# Patient Record
Sex: Male | Born: 1960
Health system: Southern US, Community
[De-identification: ages and names within clinical notes are randomized; demographics above are authoritative.]

## PROBLEM LIST (undated history)

## (undated) DIAGNOSIS — E669 Obesity, unspecified: Secondary | ICD-10-CM

## (undated) DIAGNOSIS — I10 Essential (primary) hypertension: Secondary | ICD-10-CM

## (undated) DIAGNOSIS — H9190 Unspecified hearing loss, unspecified ear: Secondary | ICD-10-CM

## (undated) HISTORY — DX: Essential (primary) hypertension: I10

## (undated) HISTORY — DX: Obesity, unspecified: E66.9

## (undated) HISTORY — DX: Unspecified hearing loss, unspecified ear: H91.90

---
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
## 1985-05-17 HISTORY — PX: MOLE REMOVAL: SHX2046

## 1999-05-18 HISTORY — PX: LASIK: SHX215

## 2005-05-17 HISTORY — PX: KNEE ARTHROSCOPY: SUR90

## 2012-07-26 ENCOUNTER — Encounter: Payer: Self-pay | Admitting: Internal Medicine

## 2012-09-28 ENCOUNTER — Ambulatory Visit (INDEPENDENT_AMBULATORY_CARE_PROVIDER_SITE_OTHER): Payer: No Typology Code available for payment source | Admitting: Internal Medicine

## 2012-09-28 ENCOUNTER — Encounter: Payer: Self-pay | Admitting: Internal Medicine

## 2012-09-28 VITALS — BP 148/90 | HR 77 | Temp 97.8°F | Ht 72.0 in | Wt 232.0 lb

## 2012-09-28 DIAGNOSIS — H9193 Unspecified hearing loss, bilateral: Secondary | ICD-10-CM

## 2012-09-28 DIAGNOSIS — H9190 Unspecified hearing loss, unspecified ear: Secondary | ICD-10-CM | POA: Insufficient documentation

## 2012-09-28 DIAGNOSIS — L989 Disorder of the skin and subcutaneous tissue, unspecified: Secondary | ICD-10-CM | POA: Insufficient documentation

## 2012-09-28 DIAGNOSIS — I10 Essential (primary) hypertension: Secondary | ICD-10-CM | POA: Insufficient documentation

## 2012-09-28 DIAGNOSIS — E669 Obesity, unspecified: Secondary | ICD-10-CM | POA: Insufficient documentation

## 2012-09-28 LAB — CBC WITH DIFFERENTIAL/PLATELET
Eosinophils Relative: 10.6 % — ABNORMAL HIGH (ref 0.0–5.0)
MCV: 90.5 fl (ref 78.0–100.0)
Monocytes Absolute: 0.5 10*3/uL (ref 0.1–1.0)
Neutrophils Relative %: 60.4 % (ref 43.0–77.0)
Platelets: 275 10*3/uL (ref 150.0–400.0)
WBC: 7.1 10*3/uL (ref 4.5–10.5)

## 2012-09-28 LAB — COMPREHENSIVE METABOLIC PANEL
Albumin: 4 g/dL (ref 3.5–5.2)
Alkaline Phosphatase: 72 U/L (ref 39–117)
CO2: 28 mEq/L (ref 19–32)
Calcium: 9.3 mg/dL (ref 8.4–10.5)
Chloride: 102 mEq/L (ref 96–112)
GFR: 92.74 mL/min (ref >60.00)
Glucose, Bld: 92 mg/dL (ref 70–99)
Potassium: 3.3 mEq/L — ABNORMAL LOW (ref 3.5–5.1)
Sodium: 139 mEq/L (ref 135–145)
Total Protein: 6.9 g/dL (ref 6.0–8.3)

## 2012-09-28 LAB — TSH: TSH: 0.76 u[IU]/mL (ref 0.35–5.50)

## 2012-09-28 MED ORDER — ATENOLOL 50 MG PO TABS: 50.0000 mg | ORAL_TABLET | Freq: Every day | ORAL | Status: DC

## 2012-09-28 NOTE — Assessment & Plan Note (Signed)
He was a serious 240 pounds in 2012, was able to go down to 200 pounds with diet and exercise. Currently 232. Plan: Advised diet and exercise, check a TSH.

## 2012-09-28 NOTE — Patient Instructions (Addendum)
Go back to atenolol 50 mg daily. Check the  blood pressure 2 or 3 times a week, be sure it is between 110/60 and 140/85. If it is consistently higher or lower, let me know Exercise 3 hours weekly Please read the information about a healthy diet. --- Come back in 6 months, fasting for a physical exam.

## 2012-09-28 NOTE — Assessment & Plan Note (Signed)
Has two skin tags and  a variety of skin lesions, has never seen dermatology is, he is somehow concerned Plan: Refer to dermatology for a checkup and consideration of skin tag removal

## 2012-09-28 NOTE — Assessment & Plan Note (Addendum)
History of hypertension, BP was well-controlled with atenolol 50 mg daily. For the last year and a half has been taking the medication sporadically, no meds for the last week. Ambulatory BPs in the 140s/90s. Plan: Labs Restart atenolol Get records. Addendum:  multiple records reviewed, keeping most recent labs, other records back to pt for safekeeping. (At some point TG were in the 300s) JP 10-14-12

## 2012-09-28 NOTE — Progress Notes (Signed)
  Subjective:    Patient ID: Zachary Cannon, male    DOB: Sep 17, 1960, 52 y.o.   MRN: [id number]  HPI New patient, here to get established. Hypertension, use to take atenolol regularly, for the last year and a half has been taking it sporadically d/t a insurance issue. Atenolol is the only medication he ever tried, it used to work very well for him. Obesity, he has been as heavy as  240 pounds. See assessment and plan. For 6 weeks history of left elbow pain, no injury, aspirin helps temporarily. Pain is on and off Has a number of skin lesions and skin tags , he is concerned about the lesions, would like skin tags removed.  Past Medical History  Diagnosis Date  . Hypertension   . Obesity (BMI 30-39.9)   . HOH (hard of hearing)     uses hearing aids age 34   Past Surgical History  Procedure Laterality Date  . Nose surgery  1982    deviated septum  . Knee arthroscopy Right 2007    meniscal tear  . Lasik  2001  . Mole removal  1987    benign, foot   History   Social History  . Marital Status: Single    Spouse Name: N/A    Number of Children: 0  . Years of Education: N/A   Occupational History  . sales rep    Social History Main Topics  . Smoking status: Never Smoker   . Smokeless tobacco: Never Used  . Alcohol Use: Yes     Comment: rarely   . Drug Use: No  . Sexually Active: Not on file   Other Topics Concern  . Not on file   Social History Narrative   Moved from [place] ~ 2012 to [address], then to [place] 2013    Family History  Problem Relation Age of Onset  . CAD Neg Hx   . Diabetes Father     late in life  . Colon cancer Neg Hx   . Prostate cancer Neg Hx      Review of Systems No chest pain or shortness or breath. No lower extremity edema.     Objective:   Physical Exam BP 148/90  Pulse 77  Temp(Src) 97.8 F (36.6 C) (Oral)  Ht 6' (1.829 m)  Wt 232 lb (105.235 kg)  BMI 31.46 kg/m2  SpO2 98%  General -- alert, well-developed,   Neck --no  thyromegaly Lungs -- normal respiratory effort, no intercostal retractions, no accessory muscle use, and normal breath sounds.   Heart-- normal rate, regular rhythm, no murmur, and no gallop.   Abdomen--soft, non-tender, no distention, no masses, no HSM, no guarding, and no rigidity.   Extremities-- no pretibial edema bilaterally  Neurologic-- alert & oriented X3 and strength normal in all extremities. Skin: Benign appearing skin tags at the  right side of the neck and left buttock. He has a variety of skin lesions. Psych-- Cognition and judgment appear intact. Alert and cooperative with normal attention span and concentration.  not anxious appearing and not depressed appearing.      Assessment & Plan:  Tennis elbow, Recommend Motrin when necessary, ice, call if no better in few weeks

## 2012-10-03 ENCOUNTER — Encounter: Payer: Self-pay | Admitting: *Deleted

## 2012-10-25 ENCOUNTER — Encounter: Payer: Self-pay | Admitting: Internal Medicine

## 2012-12-05 ENCOUNTER — Encounter: Payer: Self-pay | Admitting: Internal Medicine

## 2012-12-05 ENCOUNTER — Ambulatory Visit (INDEPENDENT_AMBULATORY_CARE_PROVIDER_SITE_OTHER): Payer: No Typology Code available for payment source | Admitting: Internal Medicine

## 2012-12-05 VITALS — BP 130/98 | HR 64 | Temp 98.6°F | Wt 239.0 lb

## 2012-12-05 DIAGNOSIS — S8991XA Unspecified injury of right lower leg, initial encounter: Secondary | ICD-10-CM

## 2012-12-05 DIAGNOSIS — S8990XA Unspecified injury of unspecified lower leg, initial encounter: Secondary | ICD-10-CM | POA: Insufficient documentation

## 2012-12-05 DIAGNOSIS — S99919A Unspecified injury of unspecified ankle, initial encounter: Secondary | ICD-10-CM

## 2012-12-05 DIAGNOSIS — I10 Essential (primary) hypertension: Secondary | ICD-10-CM

## 2012-12-05 NOTE — Patient Instructions (Addendum)
Ice the knee every night x 20 minutes Motrin 200 mg 2 tablets every 6 hours as needed for pain. Always take it with food because may cause gastritis and ulcers. If you notice nausea, stomach pain, change in the color of stools --->  Stop the medicine and let us know  Increase atenolol to 1.5 tabs a day Check the  blood pressure 2 or 3 times a week, be sure it is between 110/60 and 140/85. If it is consistently higher or lower, let me know Also call if the pulse is less than 55

## 2012-12-05 NOTE — Progress Notes (Signed)
  Subjective:    Patient ID: Zachary Cannon, male    DOB: 09-23-1960, 52 y.o.   MRN: [id number]  HPI Acute visit 3 days ago, was walking in his house, turned to the left, he felt a pop in the right knee. Since then he is having anterior-below the knee cup pain and some swelling and stiffness. Definitely the pain is different than his usual knee ache and  it is interfering with walking. Not  taking any medication for pain.  Past Medical History  Diagnosis Date  . Hypertension   . Obesity (BMI 30-39.9)   . HOH (hard of hearing)     uses hearing aids age 30   Past Surgical History  Procedure Laterality Date  . Nose surgery  1982    deviated septum  . Knee arthroscopy Right 2007    meniscal tear  . Lasik  2001  . Mole removal  1987    benign, foot     Review of Systems Good compliance with atenolol, BP today 150/98, not ambulatory BPs. Pulse 64    Objective:   Physical Exam BP 130/98  Pulse 64  Temp(Src) 98.6 F (37 C) (Other (Comment))  Wt 239 lb (108.41 kg)  BMI 32.41 kg/m2  SpO2 96% General -- alert, well-developed, NAD    Extremities-- no pretibial edema bilaterally L knee normal R knee-- small effusion on exam, ROM full but c/o pain w/ flexion, no red, slt warm, joint is table  Psych-- Cognition and judgment appear intact. Alert and cooperative with normal attention span and concentration.  not anxious appearing and not depressed appearing.        Assessment & Plan:

## 2012-12-05 NOTE — Assessment & Plan Note (Signed)
BP slightly elevated today, increase atenolol from 50 mg to 75 mg, see instructions

## 2012-12-05 NOTE — Assessment & Plan Note (Signed)
Knee injury, H/o previous surgery in that knee. Suspect internal derangement Refer to ortho, see instructions.

## 2013-06-08 ENCOUNTER — Other Ambulatory Visit: Payer: Self-pay | Admitting: Internal Medicine

## 2013-11-26 ENCOUNTER — Encounter: Payer: Self-pay | Admitting: Physician Assistant

## 2013-11-26 ENCOUNTER — Ambulatory Visit (INDEPENDENT_AMBULATORY_CARE_PROVIDER_SITE_OTHER): Payer: BC Managed Care – PPO | Admitting: Physician Assistant

## 2013-11-26 VITALS — BP 105/70 | HR 72 | Temp 97.8°F | Resp 16 | Ht 72.0 in | Wt 237.1 lb

## 2013-11-26 DIAGNOSIS — K644 Residual hemorrhoidal skin tags: Secondary | ICD-10-CM

## 2013-11-26 MED ORDER — HYDROCORTISONE ACE-PRAMOXINE 1-1 % RE FOAM: 1.0000 | Freq: Two times a day (BID) | RECTAL | Status: DC

## 2013-11-26 NOTE — Progress Notes (Signed)
Patient presents to clinic today c/o a few days of rectal pain with bright red blood on toilet paper after BM.  Patient denies abdominal pain, nausea, vomiting, fever or malaise.  Denies melena.  Denies hx of hemorrhoid or anal fissure.  Patient endorses some hard stools with straining.  Is not on fiber supplement.  Patient denies recent sexual activity.  Past Medical History  Diagnosis Date  . Hypertension   . Obesity (BMI 30-39.9)   . HOH (hard of hearing)     uses hearing aids age 53    Current Outpatient Prescriptions on File Prior to Visit  Medication Sig Dispense Refill  . Ascorbic Acid (VITAMIN C PO) Take 1 tablet by mouth daily.      Marland Kitchen. atenolol (TENORMIN) 50 MG tablet Take one tablet by mouth one time daily  90 tablet  1   No current facility-administered medications on file prior to visit.    No Known Allergies  Family History  Problem Relation Age of Onset  . CAD Neg Hx   . Diabetes Father     late in life  . Colon cancer Neg Hx   . Prostate cancer Neg Hx     History   Social History  . Marital Status: Single    Spouse Name: N/A    Number of Children: 0  . Years of Education: N/A   Occupational History  . sales rep    Social History Main Topics  . Smoking status: Never Smoker   . Smokeless tobacco: Never Used  . Alcohol Use: Yes     Comment: rarely   . Drug Use: No  . Sexual Activity: None   Other Topics Concern  . None   Social History Narrative   [place] ~ [place] [address]    Review of Systems - See HPI.  All other ROS are negative.  BP 105/70  Pulse 72  Temp(Src) 97.8 F (36.6 C) (Oral)  Resp 16  Ht 6' (1.829 m)  Wt 237 lb 2 oz (107.559 kg)  BMI 32.15 kg/m2  SpO2 96%  Physical Exam  Vitals reviewed. Constitutional: He is well-developed, well-nourished, and in no distress.  HENT:  Head: Normocephalic and atraumatic.  Eyes: Conjunctivae are normal.  Cardiovascular: Normal rate, regular rhythm, normal  heart sounds and intact distal pulses.   Pulmonary/Chest: Effort normal and breath sounds normal. No respiratory distress. He has no wheezes. He has no rales. He exhibits no tenderness.  Genitourinary: Prostate normal. Rectal exam shows external hemorrhoid and tenderness. Rectal exam shows no internal hemorrhoid, no fissure and no laceration. Guaiac negative stool.  Skin: Skin is warm and dry. No rash noted.  Psychiatric: Affect normal.   Assessment/Plan: External hemorrhoid Rx Proctofoam HC. Increase fluid intake.  Daily fiber supplement.  Stool softener.  Encouraged sitz baths. IF symptoms not improving over the next week, will need evaluation by colorectal surgery.

## 2013-11-26 NOTE — Patient Instructions (Signed)
Use Proctofoam as directed.  Increase fluid.  Use a stool softener.  Soak in warm water for 15 minutes a couple of times per day.  This will promote healing and cleaning.  Avoid wet wipes as they are irritating your skin.  Take a fiber supplement.  If symptoms are not improving or if pain worsens, we will need to set you up with a General Surgeon for further treatment.  Hemorrhoids Hemorrhoids are swollen veins around the rectum or anus. There are two types of hemorrhoids:   Internal hemorrhoids. These occur in the veins just inside the rectum. They may poke through to the outside and become irritated and painful.  External hemorrhoids. These occur in the veins outside the anus and can be felt as a painful swelling or hard lump near the anus. CAUSES  Pregnancy.   Obesity.   Constipation or diarrhea.   Straining to have a bowel movement.   Sitting for long periods on the toilet.  Heavy lifting or other activity that caused you to strain.  Anal intercourse. SYMPTOMS   Pain.   Anal itching or irritation.   Rectal bleeding.   Fecal leakage.   Anal swelling.   One or more lumps around the anus.  DIAGNOSIS  Your caregiver may be able to diagnose hemorrhoids by visual examination. Other examinations or tests that may be performed include:   Examination of the rectal area with a gloved hand (digital rectal exam).   Examination of anal canal using a small tube (scope).   A blood test if you have lost a significant amount of blood.  A test to look inside the colon (sigmoidoscopy or colonoscopy). TREATMENT Most hemorrhoids can be treated at home. However, if symptoms do not seem to be getting better or if you have a lot of rectal bleeding, your caregiver may perform a procedure to help make the hemorrhoids get smaller or remove them completely. Possible treatments include:   Placing a rubber band at the base of the hemorrhoid to cut off the circulation (rubber band  ligation).   Injecting a chemical to shrink the hemorrhoid (sclerotherapy).   Using a tool to burn the hemorrhoid (infrared light therapy).   Surgically removing the hemorrhoid (hemorrhoidectomy).   Stapling the hemorrhoid to block blood flow to the tissue (hemorrhoid stapling).  HOME CARE INSTRUCTIONS   Eat foods with fiber, such as whole grains, beans, nuts, fruits, and vegetables. Ask your doctor about taking products with added fiber in them (fibersupplements).  Increase fluid intake. Drink enough water and fluids to keep your urine clear or pale yellow.   Exercise regularly.   Go to the bathroom when you have the urge to have a bowel movement. Do not wait.   Avoid straining to have bowel movements.   Keep the anal area dry and clean. Use wet toilet paper or moist towelettes after a bowel movement.   Medicated creams and suppositories may be used or applied as directed.   Only take over-the-counter or prescription medicines as directed by your caregiver.   Take warm sitz baths for 15-20 minutes, 3-4 times a day to ease pain and discomfort.   Place ice packs on the hemorrhoids if they are tender and swollen. Using ice packs between sitz baths may be helpful.   Put ice in a plastic bag.   Place a towel between your skin and the bag.   Leave the ice on for 15-20 minutes, 3-4 times a day.   Do not use a  donut-shaped pillow or sit on the toilet for long periods. This increases blood pooling and pain.  SEEK MEDICAL CARE IF:  You have increasing pain and swelling that is not controlled by treatment or medicine.  You have uncontrolled bleeding.  You have difficulty or you are unable to have a bowel movement.  You have pain or inflammation outside the area of the hemorrhoids. MAKE SURE YOU:  Understand these instructions.  Will watch your condition.  Will get help right away if you are not doing well or get worse. Document Released: 04/30/2000  Document Revised: 04/19/2012 Document Reviewed: 03/07/2012 [facility] Patient Information 2015 [place] [place]. This information is not intended to replace advice given to you by your health care provider. Make sure you discuss any questions you have with your health care provider.

## 2013-11-26 NOTE — Progress Notes (Signed)
Pre visit review using our clinic review tool, if applicable. No additional management support is needed unless otherwise documented below in the visit note/SLS  

## 2013-11-27 DIAGNOSIS — K644 Residual hemorrhoidal skin tags: Secondary | ICD-10-CM | POA: Insufficient documentation

## 2013-11-27 NOTE — Assessment & Plan Note (Signed)
Rx Proctofoam HC. Increase fluid intake.  Daily fiber supplement.  Stool softener.  Encouraged sitz baths. IF symptoms not improving over the next week, will need evaluation by colorectal surgery.

## 2013-11-29 ENCOUNTER — Telehealth: Payer: Self-pay | Admitting: Internal Medicine

## 2013-11-29 NOTE — Telephone Encounter (Signed)
Patient left message stating that he is having more bleeding since his visit but he thinks it may be due to the medication applicator, he is now applying with latex glove on hand. He states he understands that it may take several days for the swelling to go down so he will see how they are at the beginning of next week

## 2013-11-29 NOTE — Telephone Encounter (Signed)
Patient called stating that his hemorrhoids are still not better. Please advise.

## 2013-11-29 NOTE — Telephone Encounter (Signed)
LMOM with contact name and number for return call RE: hemorrhoids and further provider instructions/SLS

## 2013-11-29 NOTE — Telephone Encounter (Signed)
Provider informed/SLS 

## 2013-11-29 NOTE — Telephone Encounter (Signed)
It will take more than 1.5 days for the medicine to take full effect.  If he is having significant pain, I will set him up urgently with a Colorectal surgeon.  Please let me know if patient agrees.

## 2013-12-20 ENCOUNTER — Other Ambulatory Visit: Payer: Self-pay | Admitting: Internal Medicine

## 2014-05-13 ENCOUNTER — Telehealth: Payer: Self-pay | Admitting: Internal Medicine

## 2014-05-13 MED ORDER — ATENOLOL 50 MG PO TABS: 50.0000 mg | ORAL_TABLET | Freq: Every day | ORAL | Status: DC

## 2014-05-13 NOTE — Telephone Encounter (Signed)
Caller name: Fayrene Fearingjames Relation to pt: self Call back number: [phone] [facility] [address]   Reason for call:   Patient states that his insurance will lapse at the end of this month and would like a refill of atenolol

## 2014-05-13 NOTE — Telephone Encounter (Signed)
Can only give 30 day supply of Atenolol, Pt has not been seen since 11/2012.

## 2014-05-13 NOTE — Telephone Encounter (Signed)
Left detailed message informing patient of this. °

## 2014-05-14 ENCOUNTER — Ambulatory Visit (INDEPENDENT_AMBULATORY_CARE_PROVIDER_SITE_OTHER): Payer: BC Managed Care – PPO | Admitting: Internal Medicine

## 2014-05-14 ENCOUNTER — Encounter: Payer: Self-pay | Admitting: Internal Medicine

## 2014-05-14 VITALS — BP 128/85 | HR 66 | Temp 97.5°F | Ht 72.0 in | Wt 231.5 lb

## 2014-05-14 DIAGNOSIS — I10 Essential (primary) hypertension: Secondary | ICD-10-CM

## 2014-05-14 DIAGNOSIS — R42 Dizziness and giddiness: Secondary | ICD-10-CM

## 2014-05-14 MED ORDER — ATENOLOL 50 MG PO TABS: 50.0000 mg | ORAL_TABLET | Freq: Every day | ORAL | Status: DC

## 2014-05-14 NOTE — Progress Notes (Signed)
   Subjective:    Patient ID: Zachary Cannon, male    DOB: [birth year], 53 y.o.   MRN: [id number]  DOS:  05/14/2014 Type of visit - description : rov Interval history: Hypertension, good medication compliance, request a 90 day supply of medicines. Ambulatory BPs 120/80 on average. Declined a flu shot Also had some episodes of dizziness, last few seconds, described as lightheadedness or spinning. Episodes happen in March for 2 weeks and then last week. Usually worse when he lays down or stands up.    ROS Denies chest pain or palpitations No fall or injury No headache, diplopia, facial paresthesias or motor deficits  Past Medical History  Diagnosis Date  . Hypertension   . Obesity (BMI 30-39.9)   . HOH (hard of hearing)     uses hearing aids age 89    Past Surgical History  Procedure Laterality Date  . Nose surgery  1982    deviated septum  . Knee arthroscopy Right 2007    meniscal tear  . Lasik  2001  . Mole removal  1987    benign, foot    History   Social History  . Marital Status: Single    Spouse Name: N/A    Number of Children: 0  . Years of Education: N/A   Occupational History  . sales rep    Social History Main Topics  . Smoking status: Never Smoker   . Smokeless tobacco: Never Used  . Alcohol Use: Yes     Comment: rarely   . Drug Use: No  . Sexual Activity: Not on file   Other Topics Concern  . Not on file   Social History Narrative   [place]         Medication List       This list is accurate as of: 05/14/14  9:10 PM.  Always use your most recent med list.               atenolol 50 MG tablet  Commonly known as:  TENORMIN  Take 1 tablet (50 mg total) by mouth daily.     VITAMIN C PO  Take 1 tablet by mouth daily.           Objective:   Physical Exam BP 128/85 mmHg  Pulse 66  Temp(Src) 97.5 F (36.4 C) (Oral)  Ht 6' (1.829 m)  Wt 231 lb 8 oz (105.008 kg)  BMI 31.39 kg/m2   SpO2 98% General -- alert, well-developed, NAD.  Neck --normal carotid pulse, no LAD HEENT-- Not pale.  Lungs -- normal respiratory effort, no intercostal retractions, no accessory muscle use, and normal breath sounds.  Heart-- normal rate, regular rhythm, no murmur.  Extremities-- no pretibial edema bilaterally  Neurologic--  alert & oriented X3. Speech normal, gait appropriate for age, strength symmetric and appropriate for age.  DTRs symmetric. EOMI, PERLA   Psych-- Cognition and judgment appear intact. Cooperative with normal attention span and concentration. No anxious or depressed appearing.         Assessment & Plan:   Dizziness, no red flag symptoms, recommend observation for now.

## 2014-05-14 NOTE — Assessment & Plan Note (Signed)
BP well-controlled, refill atenolol, come back in 4-6 months for a physical. Check a BMP, last potassium was a slightly low

## 2014-05-14 NOTE — Patient Instructions (Signed)
Get your blood work before you leave   Call if the dizziness gets worse, is different    Please come back to the office in 4-6 months  for a physical exam. Come back fasting     Check the  blood pressure 2 or 3 times a month  Be sure your blood pressure is between  145/85  and 110/65.  if it is consistently higher or lower, let me know

## 2014-05-14 NOTE — Progress Notes (Signed)
Pre visit review using our clinic review tool, if applicable. No additional management support is needed unless otherwise documented below in the visit note. 

## 2014-05-15 LAB — BASIC METABOLIC PANEL
BUN: 15 mg/dL (ref 6–23)
CO2: 27 mEq/L (ref 19–32)
CREATININE: 0.8 mg/dL (ref 0.4–1.5)
Calcium: 9.7 mg/dL (ref 8.4–10.5)
Chloride: 102 mEq/L (ref 96–112)
GFR: 108.26 mL/min (ref >60.00)
GLUCOSE: 109 mg/dL — AB (ref 70–99)
POTASSIUM: 4 meq/L (ref 3.5–5.1)
Sodium: 138 mEq/L (ref 135–145)

## 2014-05-21 ENCOUNTER — Telehealth: Payer: Self-pay | Admitting: Internal Medicine

## 2014-05-21 NOTE — Telephone Encounter (Signed)
Caller name: Gracy BruinsFink, Isadore Relation to pt: self  Call back number: [phone]   Reason for call:  Pt inquiring about results taken on 05/14/2014

## 2014-05-21 NOTE — Telephone Encounter (Signed)
Letter mailed to Pt on 05/15/2014 with lab results. All labs normal.

## 2014-09-12 ENCOUNTER — Telehealth: Payer: Self-pay | Admitting: Internal Medicine

## 2014-09-12 MED ORDER — ATENOLOL 50 MG PO TABS: 50.0000 mg | ORAL_TABLET | Freq: Every day | ORAL | Status: DC

## 2014-09-12 NOTE — Telephone Encounter (Signed)
Please advise 

## 2014-09-12 NOTE — Telephone Encounter (Signed)
Caller name:Moncus, Fayrene FearingJames Relation to UJ:WJXBpt:self Call back number:[phone] [facility] [address], [place], [place] [id number] [phone]  Reason for call: pt has moved out of the state and states that dr. Drue NovelPaz is aware, pt has not found a doctor yet, and is needing his bp meds, would like to know if dr. Drue NovelPaz can send him in a 90 day supply so that he has time to find a new dr. Malena Peerrx atenolol (TENORMIN) 50 MG tablet .

## 2014-09-12 NOTE — Telephone Encounter (Signed)
[facility] [place] as requested.

## 2014-09-12 NOTE — Telephone Encounter (Signed)
Ok to send 90 days, thanks !

## 2014-09-24 ENCOUNTER — Encounter: Payer: BC Managed Care – PPO | Admitting: Internal Medicine

## 2015-01-15 ENCOUNTER — Telehealth: Payer: Self-pay | Admitting: Internal Medicine

## 2015-01-15 MED ORDER — ATENOLOL 50 MG PO TABS: 50.0000 mg | ORAL_TABLET | Freq: Every day | ORAL | Status: DC

## 2015-01-15 NOTE — Telephone Encounter (Signed)
Caller name: Delvis Kau  Relationship to patient: Self  Can be reached: [phone]  Pharmacy:  Reason for call: pt request a refill on his atenolol Rx.

## 2015-01-15 NOTE — Telephone Encounter (Signed)
[facility]ol sent to [facility] on [address] #30 and 0refills. Pt over due for routine F/U and CPE. Last seen in 04/2014, was to F/U in April 2016. No further refills until seen. Please have Pt schedule appt.

## 2015-02-11 MED ORDER — ATENOLOL 50 MG PO TABS: 50.0000 mg | ORAL_TABLET | Freq: Every day | ORAL | Status: DC

## 2015-02-11 NOTE — Telephone Encounter (Signed)
Please advise. 30 day supply given in August 2016 and Pt was informed no further refills until seen. Pt has relocated to [place] and has not established with new PCP.

## 2015-02-11 NOTE — Telephone Encounter (Signed)
LVM for pt to schedule appt per Dr. Drue Novel per previous note.

## 2015-02-11 NOTE — Addendum Note (Signed)
Addended by: Dorette Grate on: 02/11/2015 01:15 PM   Modules accepted: Orders

## 2015-02-11 NOTE — Telephone Encounter (Addendum)
Relation to UJ:WJXB Call back number: [phone] [facility]acy: [facility], [address], [place], [place] [address] [phone]     Reason for call:  Patient has relocated to [place] and has not established care as of yet requesting a month supply. Please advise patient directly

## 2015-02-11 NOTE — Telephone Encounter (Signed)
Rx sent, #90 and 0 refills.

## 2015-02-11 NOTE — Telephone Encounter (Signed)
Okay 90 days, needs to find a new PCP

## 2015-02-11 NOTE — Telephone Encounter (Signed)
Spoke with Pt, informed him that Dr. Drue Novel has sent a 90 day prescription to [facility] in [place], [place] as requested. However, informed him that Dr. Drue Novel will not refill after this prescription that the Pt will need to establish with a PCP there. Pt verbalized understanding.

## 2020-10-15 DIAGNOSIS — U071 COVID-19: Secondary | ICD-10-CM | POA: Diagnosis not present

## 2021-01-20 DIAGNOSIS — I10 Essential (primary) hypertension: Secondary | ICD-10-CM | POA: Diagnosis not present

## 2021-02-05 ENCOUNTER — Encounter (HOSPITAL_BASED_OUTPATIENT_CLINIC_OR_DEPARTMENT_OTHER): Payer: Self-pay

## 2021-02-05 ENCOUNTER — Observation Stay (HOSPITAL_BASED_OUTPATIENT_CLINIC_OR_DEPARTMENT_OTHER)
Admission: EM | Admit: 2021-02-05 | Discharge: 2021-02-06 | Disposition: A | Discharge status: Home / Self Care | Payer: BC Managed Care – PPO | Attending: Internal Medicine | Admitting: Internal Medicine

## 2021-02-05 ENCOUNTER — Emergency Department (HOSPITAL_BASED_OUTPATIENT_CLINIC_OR_DEPARTMENT_OTHER): Payer: BC Managed Care – PPO

## 2021-02-05 ENCOUNTER — Other Ambulatory Visit: Payer: Self-pay

## 2021-02-05 DIAGNOSIS — R1033 Periumbilical pain: Secondary | ICD-10-CM | POA: Diagnosis not present

## 2021-02-05 DIAGNOSIS — Z79899 Other long term (current) drug therapy: Secondary | ICD-10-CM | POA: Diagnosis not present

## 2021-02-05 DIAGNOSIS — I10 Essential (primary) hypertension: Secondary | ICD-10-CM | POA: Diagnosis not present

## 2021-02-05 DIAGNOSIS — E669 Obesity, unspecified: Secondary | ICD-10-CM | POA: Diagnosis present

## 2021-02-05 DIAGNOSIS — R1030 Lower abdominal pain, unspecified: Secondary | ICD-10-CM | POA: Diagnosis not present

## 2021-02-05 DIAGNOSIS — K859 Acute pancreatitis without necrosis or infection, unspecified: Secondary | ICD-10-CM | POA: Diagnosis not present

## 2021-02-05 DIAGNOSIS — R109 Unspecified abdominal pain: Secondary | ICD-10-CM | POA: Diagnosis not present

## 2021-02-05 DIAGNOSIS — Z20822 Contact with and (suspected) exposure to covid-19: Secondary | ICD-10-CM | POA: Diagnosis not present

## 2021-02-05 LAB — COMPREHENSIVE METABOLIC PANEL
ALT: 31 U/L (ref 0–44)
AST: 22 U/L (ref 15–41)
Albumin: 4.4 g/dL (ref 3.5–5.0)
Alkaline Phosphatase: 58 U/L (ref 38–126)
Anion gap: 9 (ref 5–15)
BUN: 16 mg/dL (ref 6–20)
CO2: 28 mmol/L (ref 22–32)
Calcium: 9.7 mg/dL (ref 8.9–10.3)
Chloride: 102 mmol/L (ref 98–111)
Creatinine, Ser: 0.98 mg/dL (ref 0.61–1.24)
GFR, Estimated: >60 mL/min (ref >60)
Glucose, Bld: 146 mg/dL — ABNORMAL HIGH (ref 70–99)
Potassium: 3.7 mmol/L (ref 3.5–5.1)
Sodium: 139 mmol/L (ref 135–145)
Total Bilirubin: 0.5 mg/dL (ref 0.3–1.2)
Total Protein: 7.3 g/dL (ref 6.5–8.1)

## 2021-02-05 LAB — CBC
HCT: 44.1 % (ref 39.0–52.0)
HCT: 42.2 % (ref 39.0–52.0)
Hemoglobin: 15.4 g/dL (ref 13.0–17.0)
Hemoglobin: 14.2 g/dL (ref 13.0–17.0)
MCH: 31.4 pg (ref 26.0–34.0)
MCH: 30.8 pg (ref 26.0–34.0)
MCHC: 34.9 g/dL (ref 30.0–36.0)
MCHC: 33.6 g/dL (ref 30.0–36.0)
MCV: 89.8 fL (ref 80.0–100.0)
MCV: 91.5 fL (ref 80.0–100.0)
Platelets: 261 10*3/uL (ref 150–400)
Platelets: 227 10*3/uL (ref 150–400)
RBC: 4.91 MIL/uL (ref 4.22–5.81)
RBC: 4.61 MIL/uL (ref 4.22–5.81)
RDW: 13.9 % (ref 11.5–15.5)
RDW: 13.9 % (ref 11.5–15.5)
WBC: 9.1 10*3/uL (ref 4.0–10.5)
WBC: 5.9 10*3/uL (ref 4.0–10.5)
nRBC: 0 % (ref 0.0–0.2)
nRBC: 0 % (ref 0.0–0.2)

## 2021-02-05 LAB — URINALYSIS, ROUTINE W REFLEX MICROSCOPIC
Bilirubin Urine: NEGATIVE
Glucose, UA: NEGATIVE mg/dL
Hgb urine dipstick: NEGATIVE
Ketones, ur: NEGATIVE mg/dL
Leukocytes,Ua: NEGATIVE
Nitrite: NEGATIVE
Protein, ur: NEGATIVE mg/dL
Specific Gravity, Urine: 1.01 (ref 1.005–1.030)
pH: 7 (ref 5.0–8.0)

## 2021-02-05 LAB — RESP PANEL BY RT-PCR (FLU A&B, COVID) ARPGX2
Influenza A by PCR: NEGATIVE
Influenza B by PCR: NEGATIVE
SARS Coronavirus 2 by RT PCR: NEGATIVE

## 2021-02-05 LAB — AMYLASE: Amylase: 79 U/L (ref 28–100)

## 2021-02-05 LAB — LIPASE, BLOOD
Lipase: 1190 U/L — ABNORMAL HIGH (ref 11–51)
Lipase: 88 U/L — ABNORMAL HIGH (ref 11–51)

## 2021-02-05 LAB — HIV ANTIBODY (ROUTINE TESTING W REFLEX): HIV Screen 4th Generation wRfx: NONREACTIVE

## 2021-02-05 MED ORDER — IOHEXOL 350 MG/ML SOLN
75.0000 mL | Freq: Once | INTRAVENOUS | Status: AC | PRN
  Administered 2021-02-05: 75 mL via INTRAVENOUS

## 2021-02-05 MED ORDER — FENTANYL CITRATE PF 50 MCG/ML IJ SOSY: 50.0000 ug | PREFILLED_SYRINGE | Freq: Once | INTRAMUSCULAR | Status: DC

## 2021-02-05 MED ORDER — TIZANIDINE HCL 4 MG PO TABS
2.0000 mg | ORAL_TABLET | Freq: Once | ORAL | Status: AC
  Administered 2021-02-05: 2 mg via ORAL
  Filled 2021-02-05: qty 1

## 2021-02-05 MED ORDER — FENTANYL CITRATE PF 50 MCG/ML IJ SOSY
50.0000 ug | PREFILLED_SYRINGE | Freq: Once | INTRAMUSCULAR | Status: AC
Start: 2021-02-05 — End: 2021-02-05
  Administered 2021-02-05: 50 ug via INTRAVENOUS
  Filled 2021-02-05: qty 1

## 2021-02-05 MED ORDER — ACETAMINOPHEN 325 MG PO TABS: 650.0000 mg | ORAL_TABLET | Freq: Four times a day (QID) | ORAL | Status: DC | PRN

## 2021-02-05 MED ORDER — ACETAMINOPHEN 650 MG RE SUPP: 650.0000 mg | Freq: Four times a day (QID) | RECTAL | Status: DC | PRN

## 2021-02-05 MED ORDER — SODIUM CHLORIDE 0.9 % IV SOLN: INTRAVENOUS | Status: DC

## 2021-02-05 MED ORDER — SODIUM CHLORIDE 0.9 % IV BOLUS
500.0000 mL | Freq: Once | INTRAVENOUS | Status: AC
  Administered 2021-02-05: 500 mL via INTRAVENOUS

## 2021-02-05 MED ORDER — LACTATED RINGERS IV SOLN: INTRAVENOUS | Status: DC

## 2021-02-05 MED ORDER — ONDANSETRON HCL 4 MG/2ML IJ SOLN: 4.0000 mg | Freq: Four times a day (QID) | INTRAMUSCULAR | Status: DC | PRN

## 2021-02-05 MED ORDER — FENTANYL CITRATE PF 50 MCG/ML IJ SOSY: 12.5000 ug | PREFILLED_SYRINGE | INTRAMUSCULAR | Status: DC | PRN

## 2021-02-05 MED ORDER — HYDROCODONE-ACETAMINOPHEN 5-325 MG PO TABS: 1.0000 | ORAL_TABLET | ORAL | Status: DC | PRN

## 2021-02-05 MED ORDER — ENOXAPARIN SODIUM 40 MG/0.4ML IJ SOSY: 40.0000 mg | PREFILLED_SYRINGE | INTRAMUSCULAR | Status: DC

## 2021-02-05 MED ORDER — ADULT MULTIVITAMIN W/MINERALS CH
1.0000 | ORAL_TABLET | Freq: Every day | ORAL | Status: DC
  Administered 2021-02-05 – 2021-02-06 (×2): 1 via ORAL
  Filled 2021-02-05 (×2): qty 1

## 2021-02-05 MED ORDER — ONDANSETRON HCL 4 MG PO TABS: 4.0000 mg | ORAL_TABLET | Freq: Four times a day (QID) | ORAL | Status: DC | PRN

## 2021-02-05 MED ORDER — ATENOLOL 50 MG PO TABS
50.0000 mg | ORAL_TABLET | Freq: Every day | ORAL | Status: DC
  Administered 2021-02-05 – 2021-02-06 (×2): 50 mg via ORAL
  Filled 2021-02-05 (×2): qty 1

## 2021-02-05 MED ORDER — ONDANSETRON HCL 4 MG/2ML IJ SOLN
4.0000 mg | Freq: Once | INTRAMUSCULAR | Status: AC
  Administered 2021-02-05: 4 mg via INTRAVENOUS
  Filled 2021-02-05: qty 2

## 2021-02-05 MED ORDER — VITAMIN D 25 MCG (1000 UNIT) PO TABS
1000.0000 [IU] | ORAL_TABLET | Freq: Every day | ORAL | Status: DC
  Administered 2021-02-05 – 2021-02-06 (×2): 1000 [IU] via ORAL
  Filled 2021-02-05 (×2): qty 1

## 2021-02-05 MED ORDER — PANTOPRAZOLE SODIUM 20 MG PO TBEC
20.0000 mg | DELAYED_RELEASE_TABLET | Freq: Every day | ORAL | Status: DC
  Administered 2021-02-06: 20 mg via ORAL
  Filled 2021-02-05 (×2): qty 1

## 2021-02-05 NOTE — Plan of Care (Signed)
Education started with this patient.   Will continue to monitor.    S[place]ittemore, Charity fundraiser

## 2021-02-05 NOTE — ED Provider Notes (Signed)
[facility] EMERGENCY DEPT Provider Note   CSN: [id number] Arrival date & time: 02/05/21  0025     History Chief Complaint  Patient presents with   Abdominal Pain    Zachary Cannon is a 60 y.o. male.  The history is provided by the patient.  Abdominal Pain Pain location:  Periumbilical, RLQ and LLQ Pain quality: dull   Pain radiates to:  Does not radiate Pain severity:  Severe Onset quality:  Gradual Duration:  1 day Timing:  Constant Progression:  Unchanged Chronicity:  New Context: not eating, not sick contacts and not trauma   Context comment:  Did drink 2 beers on Sunday Relieved by:  Nothing Worsened by:  Nothing Ineffective treatments:  None tried Associated symptoms: no constipation, no diarrhea, no dysuria and no fever   Risk factors: has not had multiple surgeries       Past Medical History:  Diagnosis Date   HOH (hard of hearing)    uses hearing aids age 64   Hypertension    Obesity (BMI 30-39.9)     Patient Active Problem List   Diagnosis Date Noted   Acute pancreatitis 02/05/2021   External hemorrhoid 11/27/2013   Knee injury 12/05/2012   Skin lesion 09/28/2012   Hypertension    Obesity (BMI 30-39.9)    HOH (hard of hearing)     Past Surgical History:  Procedure Laterality Date   KNEE ARTHROSCOPY Right 2007   meniscal tear   LASIK  2001   MOLE REMOVAL  1987   benign, foot   NOSE SURGERY  1982   deviated septum       Family History  Problem Relation Age of Onset   CAD Neg Hx    Diabetes Father        late in life   Colon cancer Neg Hx    Prostate cancer Neg Hx     Social History   Tobacco Use   Smoking status: Never   Smokeless tobacco: Never  Substance Use Topics   Alcohol use: Yes    Comment: rarely    Drug use: No    Home Medications Prior to Admission medications   Medication Sig Start Date End Date Taking? Authorizing Provider  Ascorbic Acid (VITAMIN C PO) Take 1 tablet by mouth daily.    [provider]  atenolol (TENORMIN) 50 MG tablet Take 1 tablet (50 mg total) by mouth daily. 02/11/15   Wanda Plump, MD    Allergies    Patient has no known allergies.  Review of Systems   Review of Systems  Constitutional:  Negative for fever.  HENT:  Negative for facial swelling.   Eyes:  Negative for redness.  Respiratory:  Negative for wheezing and stridor.   Cardiovascular:  Negative for leg swelling.  Gastrointestinal:  Positive for abdominal pain. Negative for constipation and diarrhea.  Genitourinary:  Negative for dysuria.  Musculoskeletal:  Negative for neck stiffness.  Skin:  Negative for rash.  Neurological:  Negative for facial asymmetry.  Psychiatric/Behavioral:  Negative for agitation.   All other systems reviewed and are negative.  Physical Exam Updated Vital Signs BP (!) 142/103   Pulse 67   Temp 98.2 F (36.8 C) (Oral)   Resp 20   Ht 6' (1.829 m)   Wt 105 kg   SpO2 100%   BMI 31.39 kg/m   Physical Exam Vitals and nursing note reviewed.  Constitutional:      General: He is not in  acute distress.    Appearance: Normal appearance.  HENT:     Head: Normocephalic and atraumatic.     Nose: Nose normal.  Eyes:     Conjunctiva/sclera: Conjunctivae normal.     Pupils: Pupils are equal, round, and reactive to light.  Cardiovascular:     Rate and Rhythm: Normal rate and regular rhythm.     Pulses: Normal pulses.     Heart sounds: Normal heart sounds.  Pulmonary:     Effort: Pulmonary effort is normal.     Breath sounds: Normal breath sounds.  Abdominal:     General: Bowel sounds are normal.     Palpations: Abdomen is soft.     Tenderness: There is no abdominal tenderness. There is no guarding or rebound.  Musculoskeletal:        General: Normal range of motion.     Cervical back: Normal range of motion and neck supple.  Skin:    General: Skin is warm and dry.     Capillary Refill: Capillary refill takes less than 2 seconds.  Neurological:      General: No focal deficit present.     Mental Status: He is alert and oriented to person, place, and time.     Deep Tendon Reflexes: Reflexes normal.  Psychiatric:        Mood and Affect: Mood normal.        Behavior: Behavior normal.    ED Results / Procedures / Treatments   Labs (all labs ordered are listed, but only abnormal results are displayed) Results for orders placed or performed during the hospital encounter of 02/05/21  Lipase, blood  Result Value Ref Range   Lipase 1,190 (H) 11 - 51 U/L  Comprehensive metabolic panel  Result Value Ref Range   Sodium 139 135 - 145 mmol/L   Potassium 3.7 3.5 - 5.1 mmol/L   Chloride 102 98 - 111 mmol/L   CO2 28 22 - 32 mmol/L   Glucose, Bld 146 (H) 70 - 99 mg/dL   BUN 16 6 - 20 mg/dL   Creatinine, Ser 1.61 0.61 - 1.24 mg/dL   Calcium 9.7 8.9 - 09.6 mg/dL   Total Protein 7.3 6.5 - 8.1 g/dL   Albumin 4.4 3.5 - 5.0 g/dL   AST 22 15 - 41 U/L   ALT 31 0 - 44 U/L   Alkaline Phosphatase 58 38 - 126 U/L   Total Bilirubin 0.5 0.3 - 1.2 mg/dL   GFR, Estimated >04 >54 mL/min   Anion gap 9 5 - 15  CBC  Result Value Ref Range   WBC 9.1 4.0 - 10.5 K/uL   RBC 4.91 4.22 - 5.81 MIL/uL   Hemoglobin 15.4 13.0 - 17.0 g/dL   HCT 09.8 11.9 - 14.7 %   MCV 89.8 80.0 - 100.0 fL   MCH 31.4 26.0 - 34.0 pg   MCHC 34.9 30.0 - 36.0 g/dL   RDW 82.9 56.2 - 13.0 %   Platelets 261 150 - 400 K/uL   nRBC 0.0 0.0 - 0.2 %  Urinalysis, Routine w reflex microscopic Urine, Clean Catch  Result Value Ref Range   Color, Urine STRAW (A) YELLOW   APPearance CLEAR CLEAR   Specific Gravity, Urine 1.010 1.005 - 1.030   pH 7.0 5.0 - 8.0   Glucose, UA NEGATIVE NEGATIVE mg/dL   Hgb urine dipstick NEGATIVE NEGATIVE   Bilirubin Urine NEGATIVE NEGATIVE   Ketones, ur NEGATIVE NEGATIVE mg/dL   Protein, ur NEGATIVE NEGATIVE  mg/dL   Nitrite NEGATIVE NEGATIVE   Leukocytes,Ua NEGATIVE NEGATIVE   CT ABDOMEN PELVIS W CONTRAST  Result Date: 02/05/2021 CLINICAL DATA:  Acute  nonlocalized abdominal pain. EXAM: CT ABDOMEN AND PELVIS WITH CONTRAST TECHNIQUE: Multidetector CT imaging of the abdomen and pelvis was performed using the standard protocol following bolus administration of intravenous contrast. CONTRAST:  38mL OMNIPAQUE IOHEXOL 350 MG/ML SOLN COMPARISON:  None. FINDINGS: Lower chest: Lung bases are clear. Hepatobiliary: No focal liver abnormality is seen. No gallstones, gallbladder wall thickening, or biliary dilatation. Pancreas: Unremarkable. No pancreatic ductal dilatation or surrounding inflammatory changes. Spleen: Normal in size without focal abnormality. Adrenals/Urinary Tract: No adrenal gland nodules. 3 mm stone in the lower pole right kidney. No hydronephrosis or hydroureter. Nephrograms are symmetrical. Bladder is normal. Stomach/Bowel: Stomach, small bowel, and colon are not abnormally distended. No wall thickening or inflammatory changes are identified. The appendix is not specifically identified but no inflammatory changes are seen that would correspond to acute appendicitis. Vascular/Lymphatic: Normal caliber abdominal aorta. No aneurysm. 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 Reproductive: Prostate is unremarkable. Other: No free air or free fluid in the abdomen. Minimal periumbilical hernia containing fat. Musculoskeletal: No acute or significant osseous findings. IMPRESSION: No acute process demonstrated in the abdomen or pelvis. No evidence of bowel obstruction or inflammation. Appendix is not visualized but no inflammatory changes are seen in the right lower quadrant. Nonobstructing stone in the right kidney. Electronically Signed   By: Burman Nieves M.D.   On: 02/05/2021 03:39    None  Radiology CT ABDOMEN PELVIS W CONTRAST  Result Date: 02/05/2021 CLINICAL DATA:  Acute nonlocalized abdominal pain. EXAM: CT ABDOMEN AND PELVIS WITH CONTRAST TECHNIQUE: Multidetector CT imaging of the abdomen and pelvis was performed using the  standard protocol following bolus administration of intravenous contrast. CONTRAST:  55mL OMNIPAQUE IOHEXOL 350 MG/ML SOLN COMPARISON:  None. FINDINGS: Lower chest: Lung bases are clear. Hepatobiliary: No focal liver abnormality is seen. No gallstones, gallbladder wall thickening, or biliary dilatation. Pancreas: Unremarkable. No pancreatic ductal dilatation or surrounding inflammatory changes. Spleen: Normal in size without focal abnormality. Adrenals/Urinary Tract: No adrenal gland nodules. 3 mm stone in the lower pole right kidney. No hydronephrosis or hydroureter. Nephrograms are symmetrical. Bladder is normal. Stomach/Bowel: Stomach, small bowel, and colon are not abnormally distended. No wall thickening or inflammatory changes are identified. The appendix is not specifically identified but no inflammatory changes are seen that would correspond to acute appendicitis. Vascular/Lymphatic: Normal caliber abdominal aorta. No aneurysm. 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 Reproductive: Prostate is unremarkable. Other: No free air or free fluid in the abdomen. Minimal periumbilical hernia containing fat. Musculoskeletal: No acute or significant osseous findings. IMPRESSION: No acute process demonstrated in the abdomen or pelvis. No evidence of bowel obstruction or inflammation. Appendix is not visualized but no inflammatory changes are seen in the right lower quadrant. Nonobstructing stone in the right kidney. Electronically Signed   By: Burman Nieves M.D.   On: 02/05/2021 03:39    Procedures Procedures   Medications Ordered in ED Medications  ondansetron (ZOFRAN) injection 4 mg (4 mg Intravenous Given 02/05/21 0357)  sodium chloride 0.9 % bolus 500 mL (500 mLs Intravenous New Bag/Given 02/05/21 0358)  iohexol (OMNIPAQUE) 350 MG/ML injection 75 mL (75 mLs Intravenous Contrast Given 02/05/21 0316)  fentaNYL (SUBLIMAZE) injection 50 mcg (50 mcg Intravenous Given 02/05/21 0358)    ED  Course  I have reviewed the triage vital signs and the nursing  notes.  Pertinent labs & imaging results that were available during my care of the patient were reviewed by me and considered in my medical decision making (see chart for details).     Final Clinical Impression(s) / ED Diagnoses Final diagnoses:  Acute pancreatitis, unspecified complication status, unspecified pancreatitis type   Admit to medicine  Rx / DC Orders ED Discharge Orders     None        Lachele Lievanos, MD 02/05/21 [phone]

## 2021-02-05 NOTE — ED Triage Notes (Signed)
Patient here POV from Home with ABD Pain.  Pain is mainly located in the Lower [place], Bilaterally, of the ABD and began approximately at 1700 yesterday. Patient concerned for Appendicitis. No Nausea. No Vomiting. No Diarrhea.   NAD Noted during Triage. A&Ox4. GCS 15. Ambulatory. Minimal Relief with Medications such as Mylanta and Pepto.

## 2021-02-05 NOTE — H&P (Addendum)
History and Physical    DOA: 02/05/2021  PCP: Pcp, No  Patient coming from: home  Chief Complaint: Abdominal pain x1 day  HPI: Zachary Cannon is a 60 y.o. male with history h/o hypertension presented to [facility] emergency department around midnight with complaints of abdominal pain--in the periumbilical and lower abdominal area 2-4/10, nonradiating which started the day prior  while he was riding his bike and gradually worsened, not associated with nausea or vomiting or food intake.  He does report drinking 2 beers past weekend while watching football game (drinks occasionally on weekends in general).  Denies any prior history of gastritis or pancreatitis but reports being stressed lately. Was having headache, trouble sleeping and was taking over the counter ibuprofen, robitussin-Z and some kind of herbal tea.  ED course: Vital stable, CBC/BMP within normal limits with creatinine 0.98, lipase elevated at 1190, total bili 0.5, normal transaminases.  U/A WNL. CT abdomen unremarkable with no evidence of gallstones or gallbladder inflammation or pancreatic inflammation.  Patient received total of 100 mcg of fentanyl IV, 1 dose of IV Zofran and 500 mL normal saline bolus at outside facility. Transfer/admission to [facility] requested for further management of acute pancreatitis given persistent abdominal pain and elevated lipase levels.  Patient currently n.p.o. with IV normal saline running at 125/h. Rates his pain as 2/10, dull achy currently. Last BM-yesterday evening. No melena or hematochezia   Review of Systems: As per HPI, otherwise review of systems negative.    Past Medical History:  Diagnosis Date   HOH (hard of hearing)    uses hearing aids age 57   Hypertension    Obesity (BMI 30-39.9)     Past Surgical History:  Procedure Laterality Date   KNEE ARTHROSCOPY Right 2007   meniscal tear   LASIK  2001   MOLE REMOVAL  1987   benign, foot   NOSE SURGERY  1982   deviated septum    Social  history:  reports that he has never smoked. He has never used smokeless tobacco. He reports current alcohol use. He reports that he does not use drugs.   No Known Allergies  Family History  Problem Relation Age of Onset   CAD Neg Hx    Diabetes Father        late in life   Colon cancer Neg Hx    Prostate cancer Neg Hx       Prior to Admission medications   Medication Sig Start Date End Date Taking? Authorizing Provider  Ascorbic Acid (VITAMIN C PO) Take 1 tablet by mouth daily.   Yes [provider]  atenolol (TENORMIN) 50 MG tablet Take 1 tablet (50 mg total) by mouth daily. 02/11/15  Yes Paz, Nolon Rod, MD  cholecalciferol (VITAMIN D3) 25 MCG (1000 UNIT) tablet Take 1,000 Units by mouth daily.   Yes [provider]  Misc Natural Products (LUTEIN 20 PO) Take 1 tablet by mouth daily.   Yes [provider]  Multiple Vitamin (MULTIVITAMIN WITH MINERALS) TABS tablet Take 1 tablet by mouth daily.   Yes [provider]    Physical Exam: Vitals:   02/05/21 0045 02/05/21 0354 02/05/21 0700 02/05/21 0745  BP:  (!) 142/103 (!) 144/85 127/88  Pulse:  67 (!) 58 69  Resp:  20 16 16   Temp:      TempSrc:      SpO2:  100% 99% 100%  Weight: 105 kg     Height: 6' (1.829 m)  Constitutional: NAD, calm, comfortable Eyes: PERRL, lids and conjunctivae normal ENMT: Mucous membranes are moist. Posterior pharynx clear of any exudate or lesions.Normal dentition.  Neck: normal, supple, no masses, no thyromegaly Respiratory: clear to auscultation bilaterally, no wheezing, no crackles. Normal respiratory effort. No accessory muscle use.  Cardiovascular: Regular rate and rhythm, no murmurs / rubs / gallops. No extremity edema. 2+ pedal pulses. No carotid bruits.  Abdomen: mild lower abdominal wall reproducible pain but no epigastric/umbilical area tenderness/guarding or rebound. No hepatosplenomegaly. Bowel sounds positive.  Musculoskeletal: no clubbing /  cyanosis. No joint deformity upper and lower extremities. Good ROM, no contractures. Normal muscle tone.  Neurologic: CN 2-12 grossly intact. Sensation intact, DTR normal. Strength 5/5 in all 4.  Psychiatric: Normal judgment and insight. Alert and oriented x 3. Normal mood.  SKIN/catheters: no rashes, lesions, ulcers. No induration  Labs on Admission: I have personally reviewed following labs and imaging studies  CBC: Recent Labs  Lab 02/05/21 0051  WBC 9.1  HGB 15.4  HCT 44.1  MCV 89.8  PLT 261   Basic Metabolic Panel: Recent Labs  Lab 02/05/21 0051  NA 139  K 3.7  CL 102  CO2 28  GLUCOSE 146*  BUN 16  CREATININE 0.98  CALCIUM 9.7   GFR: Estimated Creatinine Clearance: 104.2 mL/min (by C-G formula based on SCr of 0.98 mg/dL). Recent Labs  Lab 02/05/21 0051  WBC 9.1   Liver Function Tests: Recent Labs  Lab 02/05/21 0051  AST 22  ALT 31  ALKPHOS 58  BILITOT 0.5  PROT 7.3  ALBUMIN 4.4   Recent Labs  Lab 02/05/21 0051  LIPASE 1,190*   No results for input(s): AMMONIA in the last 168 hours. Coagulation Profile: No results for input(s): INR, PROTIME in the last 168 hours. Cardiac Enzymes: No results for input(s): CKTOTAL, CKMB, CKMBINDEX, TROPONINI in the last 168 hours. BNP (last 3 results) No results for input(s): PROBNP in the last 8760 hours. HbA1C: No results for input(s): HGBA1C in the last 72 hours. CBG: No results for input(s): GLUCAP in the last 168 hours. Lipid Profile: No results for input(s): CHOL, HDL, LDLCALC, TRIG, CHOLHDL, LDLDIRECT in the last 72 hours. Thyroid Function Tests: No results for input(s): TSH, T4TOTAL, FREET4, T3FREE, THYROIDAB in the last 72 hours. Anemia Panel: No results for input(s): VITAMINB12, FOLATE, FERRITIN, TIBC, IRON, RETICCTPCT in the last 72 hours. Urine analysis:    Component Value Date/Time   COLORURINE STRAW (A) 02/05/2021 0051   APPEARANCEUR CLEAR 02/05/2021 0051   LABSPEC 1.010 02/05/2021 0051    PHURINE 7.0 02/05/2021 0051   GLUCOSEU NEGATIVE 02/05/2021 0051   HGBUR NEGATIVE 02/05/2021 0051   BILIRUBINUR NEGATIVE 02/05/2021 0051   KETONESUR NEGATIVE 02/05/2021 0051   PROTEINUR NEGATIVE 02/05/2021 0051   NITRITE NEGATIVE 02/05/2021 0051   LEUKOCYTESUR NEGATIVE 02/05/2021 0051    Radiological Exams on Admission: Personally reviewed  CT ABDOMEN PELVIS W CONTRAST  Result Date: 02/05/2021 CLINICAL DATA:  Acute nonlocalized abdominal pain. EXAM: CT ABDOMEN AND PELVIS WITH CONTRAST TECHNIQUE: Multidetector CT imaging of the abdomen and pelvis was performed using the standard protocol following bolus administration of intravenous contrast. CONTRAST:  22mL OMNIPAQUE IOHEXOL 350 MG/ML SOLN COMPARISON:  None. FINDINGS: Lower chest: Lung bases are clear. Hepatobiliary: No focal liver abnormality is seen. No gallstones, gallbladder wall thickening, or biliary dilatation. Pancreas: Unremarkable. No pancreatic ductal dilatation or surrounding inflammatory changes. Spleen: Normal in size without focal abnormality. Adrenals/Urinary Tract: No adrenal gland nodules. 3 mm stone in  the lower pole right kidney. No hydronephrosis or hydroureter. Nephrograms are symmetrical. Bladder is normal. Stomach/Bowel: Stomach, small bowel, and colon are not abnormally distended. No wall thickening or inflammatory changes are identified. The appendix is not specifically identified but no inflammatory changes are seen that would correspond to acute appendicitis. Vascular/Lymphatic: Normal caliber abdominal aorta. No aneurysm. 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 Reproductive: Prostate is unremarkable. Other: No free air or free fluid in the abdomen. Minimal periumbilical hernia containing fat. Musculoskeletal: No acute or significant osseous findings. IMPRESSION: No acute process demonstrated in the abdomen or pelvis. No evidence of bowel obstruction or inflammation. Appendix is not visualized but no  inflammatory changes are seen in the right lower quadrant. Nonobstructing stone in the right kidney. Electronically Signed   By: Burman Nieves M.D.   On: 02/05/2021 03:39        Assessment and Plan:   Active Problems:   Acute pancreatitis    1.Abdominal pain: Treating as acute pancreatitis given elevated lipase but could be musculoskeletal as clinical picture/CT findings inconsistent with pancreatitis.  No significant inflammatory or necrotic changes noted on CT abdomen/pelvis. U/A unremarkable. Unclear etiology, not on any medications to predispose to pancreatitis and no evidence of gallbladder pathology on CT.  He does drink alcohol which could be the culprit. Will give PPI for possible gastritis, advised to avoid stressors and OTC products for stress relief especially herbal products. Minimize alcohol use.  Continue supportive management with as needed antiemetics, analgesics, IV fluids , initiate clear liquid diet and advance as tolerated.  Repeat lipase/amylase levels.  LFTs in a.m.Check lipid profile/triglyceride levels. Try Tizanidine x 1 dose to see if pain is relieved.   2.  Hypertension: Resume home medications.  3.  Obesity (BMI 30-39.9): Follow-up PCP for long-term goals.      DVT prophylaxis: Lovenox  COVID screen: Negative  Code Status: Full code.  Patient/Family Communication: Discussed with patient and all questions answered to satisfaction.  Consults called: None Admission status :Patient will be admitted under OBSERVATION status.The patient's presenting symptoms, physical exam findings, and initial radiographic and laboratory data in the context of their medical condition is felt to place them at low risk for further clinical deterioration. Furthermore, it is anticipated that the patient will be medically stable for discharge from the hospital within 2 midnights of hospital stay.      Alessandra Bevels MD [facility] Pager in [place]  If 7PM-7AM, please  contact night-coverage [email or web address]   02/05/2021, 11:21 AM

## 2021-02-06 DIAGNOSIS — Z20822 Contact with and (suspected) exposure to covid-19: Secondary | ICD-10-CM | POA: Diagnosis not present

## 2021-02-06 DIAGNOSIS — Z79899 Other long term (current) drug therapy: Secondary | ICD-10-CM | POA: Diagnosis not present

## 2021-02-06 DIAGNOSIS — R1033 Periumbilical pain: Secondary | ICD-10-CM | POA: Diagnosis not present

## 2021-02-06 DIAGNOSIS — K859 Acute pancreatitis without necrosis or infection, unspecified: Secondary | ICD-10-CM | POA: Diagnosis not present

## 2021-02-06 DIAGNOSIS — I10 Essential (primary) hypertension: Secondary | ICD-10-CM | POA: Diagnosis not present

## 2021-02-06 LAB — CBC
HCT: 42.9 % (ref 39.0–52.0)
Hemoglobin: 14.6 g/dL (ref 13.0–17.0)
MCH: 30.9 pg (ref 26.0–34.0)
MCHC: 34 g/dL (ref 30.0–36.0)
MCV: 90.9 fL (ref 80.0–100.0)
Platelets: 220 10*3/uL (ref 150–400)
RBC: 4.72 MIL/uL (ref 4.22–5.81)
RDW: 13.7 % (ref 11.5–15.5)
WBC: 6.3 10*3/uL (ref 4.0–10.5)
nRBC: 0 % (ref 0.0–0.2)

## 2021-02-06 LAB — COMPREHENSIVE METABOLIC PANEL
ALT: 27 U/L (ref 0–44)
AST: 20 U/L (ref 15–41)
Albumin: 3.7 g/dL (ref 3.5–5.0)
Alkaline Phosphatase: 59 U/L (ref 38–126)
Anion gap: 7 (ref 5–15)
BUN: 9 mg/dL (ref 6–20)
CO2: 26 mmol/L (ref 22–32)
Calcium: 8.8 mg/dL — ABNORMAL LOW (ref 8.9–10.3)
Chloride: 105 mmol/L (ref 98–111)
Creatinine, Ser: 0.81 mg/dL (ref 0.61–1.24)
GFR, Estimated: >60 mL/min (ref >60)
Glucose, Bld: 96 mg/dL (ref 70–99)
Potassium: 4 mmol/L (ref 3.5–5.1)
Sodium: 138 mmol/L (ref 135–145)
Total Bilirubin: 1 mg/dL (ref 0.3–1.2)
Total Protein: 6.5 g/dL (ref 6.5–8.1)

## 2021-02-06 LAB — LIPID PANEL
Cholesterol: 163 mg/dL (ref 0–200)
HDL: 28 mg/dL — ABNORMAL LOW (ref >40)
LDL Cholesterol: 104 mg/dL — ABNORMAL HIGH (ref 0–99)
Total CHOL/HDL Ratio: 5.8 RATIO
Triglycerides: 153 mg/dL — ABNORMAL HIGH (ref <150)
VLDL: 31 mg/dL (ref 0–40)

## 2021-02-06 LAB — LIPASE, BLOOD: Lipase: 73 U/L — ABNORMAL HIGH (ref 11–51)

## 2021-02-06 NOTE — Discharge Summary (Signed)
Physician Discharge Summary  Zachary Cannon TFT:[id number] DOB: [birth year] DOA: 02/05/2021  PCP: Pcp, No  Admit date: 02/05/2021 Discharge date: 02/06/2021  Admitted From: Home Discharge disposition: Home   Code Status: Full Code   Discharge Diagnosis:   Active Problems:   Hypertension   Obesity (BMI 30-39.9)   Acute pancreatitis   Abdominal pain    Chief Complaint  Patient presents with   Abdominal Pain    Brief narrative: Zachary Cannon is a 60 y.o. male with PMH significant for HTN who presented to the ED on 9/22 with complaint of acute onset periumbilical and lower abdominal pain that gradually worsened over duration of 24 hours.  He occasionally drinks alcohol.  Last drink was 2 beers in the weekend while watching football game.  In the ED patient was hemodynamically stable. Labs with creatinine 0.98, lipase elevated to 1190 CT abdomen and pelvis was unremarkable, including no evidence of pancreatitis. However patient continued to have abdominal pain and had elevated lipase and hence admitted to hospitalist service with clinical diagnosis of acute pancreatitis. See below for details.   Subjective: Patient was seen and examined this afternoon.  Pleasant middle-aged Caucasian male.  Not in distress.  No new symptoms.  Abdominal pain resolved.  Able to tolerate solid diet. He states that he is an occasional drinker only.  Assessment/Plan: Acute pancreatitis -Presented with 24 hours of abdominal pain, without nausea and vomiting.   -Lipase level elevated to more than 1000. -Although CT abdomen did not show any imaging evidence of pancreatitis, patient clinically fits the diagnosis of acute pancreatitis.  He was started on conservative management with IV hydration , antiemetic, analgesic, bowel rest.  His symptoms significantly improved faster than expected, tolerated diet and is ready to go home today. -Lipase level significantly improved as well. Recent Labs  Lab  02/05/21 0051 02/05/21 1243 02/06/21 0856  LIPASE 1,190* 88* 73*  AMYLASE  --  79  --    Essential hypertension -On atenolol 50 mg daily at home.  Continue the same.   Allergies as of 02/06/2021   No Known Allergies      Medication List     TAKE these medications    atenolol 50 MG tablet Commonly known as: TENORMIN Take 1 tablet (50 mg total) by mouth daily.   cholecalciferol 25 MCG (1000 UNIT) tablet Commonly known as: VITAMIN D3 Take 1,000 Units by mouth daily.   LUTEIN 20 PO Take 1 tablet by mouth daily.   multivitamin with minerals Tabs tablet Take 1 tablet by mouth daily.   VITAMIN C PO Take 1 tablet by mouth daily.        Discharge Instructions:  Diet Recommendation:  Discharge Diet Orders (From admission, onward)     Start     Ordered   02/06/21 0000  Diet general        02/06/21 1402            Follow with Primary MD Pcp, No in 7 days   Get CBC/BMP checked in next visit within 1 week by PCP or SNF MD ( we routinely change or add medications that can affect your baseline labs and fluid status, therefore we recommend that you get the mentioned basic workup next visit with your PCP, your PCP may decide not to get them or add new tests based on their clinical decision)  On your next visit with your PCP, please Get Medicines reviewed and adjusted.  Please request your PCP  to go  over all Hospital Tests and Procedure/Radiological results at the follow up, please get all Hospital records sent to your Prim MD by signing hospital release before you go home.  Activity: As tolerated with Full fall precautions use walker/cane & assistance as needed  For Heart failure patients - Check your Weight same time everyday, if you gain over 2 pounds, or you develop in leg swelling, experience more shortness of breath or chest pain, call your Primary MD immediately. Follow Cardiac Low Salt Diet and 1.5 lit/day fluid restriction.  If you have smoked or chewed  Tobacco in the last 2 yrs please stop smoking, stop any regular Alcohol  and or any Recreational drug use.  If you experience worsening of your admission symptoms, develop shortness of breath, life threatening emergency, suicidal or homicidal thoughts you must seek medical attention immediately by calling 911 or calling your MD immediately  if symptoms less severe.  You Must read complete instructions/literature along with all the possible adverse reactions/side effects for all the Medicines you take and that have been prescribed to you. Take any new Medicines after you have completely understood and accpet all the possible adverse reactions/side effects.   Do not drive, operate heavy machinery, perform activities at heights, swimming or participation in water activities or provide baby sitting services if your were admitted for syncope or siezures until you have seen by Primary MD or a Neurologist and advised to do so again.  Do not drive when taking Pain medications.  Do not take more than prescribed Pain, Sleep and Anxiety Medications  Wear Seat belts while driving.   Please note You were cared for by a hospitalist during your hospital stay. If you have any questions about your discharge medications or the care you received while you were in the hospital after you are discharged, you can call the unit and asked to speak with the hospitalist on call if the hospitalist that took care of you is not available. Once you are discharged, your primary care physician will handle any further medical issues. Please note that NO REFILLS for any discharge medications will be authorized once you are discharged, as it is imperative that you return to your primary care physician (or establish a relationship with a primary care physician if you do not have one) for your aftercare needs so that they can reassess your need for medications and monitor your lab values.    Follow ups:    Follow-up Information      [facility] Follow up.   Contact information: [address] [place] [address] [phone]                Wound care:     Discharge Exam:   Vitals:   02/05/21 1649 02/05/21 2130 02/06/21 0526 02/06/21 1427  BP: 108/71 133/86 (!) 144/82 (!) 143/93  Pulse: 64 61 62 65  Resp: 17 17 17 16   Temp: 98 F (36.7 C) 98.1 F (36.7 C) 97.7 F (36.5 C) 98 F (36.7 C)  TempSrc: Oral Oral Oral   SpO2: 95% 100% 100% 100%  Weight:      Height:        Body mass index is 31.39 kg/m.  General exam: Pleasant, middle-aged Caucasian male.  Not in distress Skin: No rashes, lesions or ulcers. HEENT: Atraumatic, normocephalic, no obvious bleeding Lungs: Clear to auscultation bilaterally CVS: Regular rate and rhythm, no murmur GI/Abd soft, nontender, nondistended, bowel sound present CNS: Alert,  awake, oriented x3 Psychiatry: Mood appropriate Extremities: No pedal edema, no calf tenderness  Time coordinating discharge: 35 minutes   The results of significant diagnostics from this hospitalization (including imaging, microbiology, ancillary and laboratory) are listed below for reference.    Procedures and Diagnostic Studies:   CT ABDOMEN PELVIS W CONTRAST  Result Date: 02/05/2021 CLINICAL DATA:  Acute nonlocalized abdominal pain. EXAM: CT ABDOMEN AND PELVIS WITH CONTRAST TECHNIQUE: Multidetector CT imaging of the abdomen and pelvis was performed using the standard protocol following bolus administration of intravenous contrast. CONTRAST:  60mL OMNIPAQUE IOHEXOL 350 MG/ML SOLN COMPARISON:  None. FINDINGS: Lower chest: Lung bases are clear. Hepatobiliary: No focal liver abnormality is seen. No gallstones, gallbladder wall thickening, or biliary dilatation. Pancreas: Unremarkable. No pancreatic ductal dilatation or surrounding inflammatory changes. Spleen: Normal in size without focal abnormality. Adrenals/Urinary Tract: No adrenal gland  nodules. 3 mm stone in the lower pole right kidney. No hydronephrosis or hydroureter. Nephrograms are symmetrical. Bladder is normal. Stomach/Bowel: Stomach, small bowel, and colon are not abnormally distended. No wall thickening or inflammatory changes are identified. The appendix is not specifically identified but no inflammatory changes are seen that would correspond to acute appendicitis. Vascular/Lymphatic: Normal caliber abdominal aorta. No aneurysm. 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 Reproductive: Prostate is unremarkable. Other: No free air or free fluid in the abdomen. Minimal periumbilical hernia containing fat. Musculoskeletal: No acute or significant osseous findings. IMPRESSION: No acute process demonstrated in the abdomen or pelvis. No evidence of bowel obstruction or inflammation. Appendix is not visualized but no inflammatory changes are seen in the right lower quadrant. Nonobstructing stone in the right kidney. Electronically Signed   By: Burman Nieves M.D.   On: 02/05/2021 03:39     Labs:   Basic Metabolic Panel: Recent Labs  Lab 02/05/21 0051 02/06/21 0317  NA 139 138  K 3.7 4.0  CL 102 105  CO2 28 26  GLUCOSE 146* 96  BUN 16 9  CREATININE 0.98 0.81  CALCIUM 9.7 8.8*   GFR Estimated Creatinine Clearance: 126.1 mL/min (by C-G formula based on SCr of 0.81 mg/dL). Liver Function Tests: Recent Labs  Lab 02/05/21 0051 02/06/21 0317  AST 22 20  ALT 31 27  ALKPHOS 58 59  BILITOT 0.5 1.0  PROT 7.3 6.5  ALBUMIN 4.4 3.7   Recent Labs  Lab 02/05/21 0051 02/05/21 1243 02/06/21 0856  LIPASE 1,190* 88* 73*  AMYLASE  --  79  --    No results for input(s): AMMONIA in the last 168 hours. Coagulation profile No results for input(s): INR, PROTIME in the last 168 hours.  CBC: Recent Labs  Lab 02/05/21 0051 02/05/21 1243 02/06/21 0317  WBC 9.1 5.9 6.3  HGB 15.4 14.2 14.6  HCT 44.1 42.2 42.9  MCV 89.8 91.5 90.9  PLT 261 227 220    Cardiac Enzymes: No results for input(s): CKTOTAL, CKMB, CKMBINDEX, TROPONINI in the last 168 hours. BNP: Invalid input(s): POCBNP CBG: No results for input(s): GLUCAP in the last 168 hours. D-Dimer No results for input(s): DDIMER in the last 72 hours. Hgb A1c No results for input(s): HGBA1C in the last 72 hours. Lipid Profile Recent Labs    02/06/21 0317  CHOL 163  HDL 28*  LDLCALC 104*  TRIG 153*  CHOLHDL 5.8   Thyroid function studies No results for input(s): TSH, T4TOTAL, T3FREE, THYROIDAB in the last 72 hours.  Invalid input(s): FREET3 Anemia work up No results for input(s): VITAMINB12, FOLATE, FERRITIN,  TIBC, IRON, RETICCTPCT in the last 72 hours. Microbiology Recent Results (from the past 240 hour(s))  Resp Panel by RT-PCR (Flu A&B, Covid) Nasopharyngeal Swab     Status: None   Collection Time: 02/05/21  4:00 AM   Specimen: Nasopharyngeal Swab; Nasopharyngeal(NP) swabs in vial transport medium  Result Value Ref Range Status   SARS Coronavirus 2 by RT PCR NEGATIVE NEGATIVE Final    Comment: (NOTE) SARS-CoV-2 target nucleic acids are NOT DETECTED.  The SARS-CoV-2 RNA is generally detectable in upper respiratory specimens during the acute phase of infection. The lowest concentration of SARS-CoV-2 viral copies this assay can detect is 138 copies/mL. A negative result does not preclude SARS-Cov-2 infection and should not be used as the sole basis for treatment or other patient management decisions. A negative result may occur with  improper specimen collection/handling, submission of specimen other than nasopharyngeal swab, presence of viral mutation(s) within the areas targeted by this assay, and inadequate number of viral copies(<138 copies/mL). A negative result must be combined with clinical observations, patient history, and epidemiological information. The expected result is Negative.  Fact Sheet for Patients:   BloggerCourse.com  Fact Sheet for Healthcare Providers:  SeriousBroker.it  This test is no t yet approved or cleared by the [place] FDA and  has been authorized for detection and/or diagnosis of SARS-CoV-2 by FDA under an Emergency Use Authorization (EUA). This EUA will remain  in effect (meaning this test can be used) for the duration of the COVID-19 declaration under Section 564(b)(1) of the Act, 21 U.S.C.section 360bbb-3(b)(1), unless the authorization is terminated  or revoked sooner.       Influenza A by PCR NEGATIVE NEGATIVE Final   Influenza B by PCR NEGATIVE NEGATIVE Final    Comment: (NOTE) The Xpert Xpress SARS-CoV-2/FLU/RSV plus assay is intended as an aid in the diagnosis of influenza from Nasopharyngeal swab specimens and should not be used as a sole basis for treatment. Nasal washings and aspirates are unacceptable for Xpert Xpress SARS-CoV-2/FLU/RSV testing.  Fact Sheet for Patients: BloggerCourse.com  Fact Sheet for Healthcare Providers: SeriousBroker.it  This test is not yet approved or cleared by the [place] FDA and has been authorized for detection and/or diagnosis of SARS-CoV-2 by FDA under an Emergency Use Authorization (EUA). This EUA will remain in effect (meaning this test can be used) for the duration of the COVID-19 declaration under Section 564(b)(1) of the Act, 21 U.S.C. section 360bbb-3(b)(1), unless the authorization is terminated or revoked.  Performed at [facility], [address] [place], [place] [address]      Signed: Lorin Glass  [facility] 02/06/2021, 3:08 PM

## 2021-02-27 DIAGNOSIS — H903 Sensorineural hearing loss, bilateral: Secondary | ICD-10-CM | POA: Diagnosis not present

## 2021-03-17 ENCOUNTER — Ambulatory Visit (INDEPENDENT_AMBULATORY_CARE_PROVIDER_SITE_OTHER): Payer: BC Managed Care – PPO | Admitting: Primary Care

## 2021-03-17 ENCOUNTER — Encounter (INDEPENDENT_AMBULATORY_CARE_PROVIDER_SITE_OTHER): Payer: Self-pay | Admitting: Primary Care

## 2021-03-17 ENCOUNTER — Other Ambulatory Visit: Payer: Self-pay

## 2021-03-17 VITALS — BP 143/95 | HR 66 | Temp 97.5°F | Ht 72.0 in | Wt 223.0 lb

## 2021-03-17 DIAGNOSIS — Z09 Encounter for follow-up examination after completed treatment for conditions other than malignant neoplasm: Secondary | ICD-10-CM | POA: Diagnosis not present

## 2021-03-17 DIAGNOSIS — Z23 Encounter for immunization: Secondary | ICD-10-CM

## 2021-03-17 DIAGNOSIS — E7800 Pure hypercholesterolemia, unspecified: Secondary | ICD-10-CM

## 2021-03-17 DIAGNOSIS — I10 Essential (primary) hypertension: Secondary | ICD-10-CM

## 2021-03-17 DIAGNOSIS — E669 Obesity, unspecified: Secondary | ICD-10-CM

## 2021-03-17 MED ORDER — HYDROCHLOROTHIAZIDE 25 MG PO TABS: 25.0000 mg | ORAL_TABLET | Freq: Every day | ORAL | 0 refills | Status: DC

## 2021-03-17 NOTE — Patient Instructions (Signed)
Influenza, Adult °Influenza is also called "the flu." It is an infection in the lungs, nose, and throat (respiratory tract). It spreads easily from person to person (is contagious). The flu causes symptoms that are like a cold, along with high fever and body aches. °What are the causes? °This condition is caused by the influenza virus. You can get the virus by: °Breathing in droplets that are in the air after a person infected with the flu coughed or sneezed. °Touching something that has the virus on it and then touching your mouth, nose, or eyes. °What increases the risk? °Certain things may make you more likely to get the flu. These include: °Not washing your hands often. °Having close contact with many people during cold and flu season. °Touching your mouth, eyes, or nose without first washing your hands. °Not getting a flu shot every year. °You may have a higher risk for the flu, and serious problems, such as a lung infection (pneumonia), if you: °Are older than 65. °Are pregnant. °Have a weakened disease-fighting system (immune system) because of a disease or because you are taking certain medicines. °Have a long-term (chronic) condition, such as: °Heart, kidney, or lung disease. °Diabetes. °Asthma. °Have a liver disorder. °Are very overweight (morbidly obese). °Have anemia. °What are the signs or symptoms? °Symptoms usually begin suddenly and last 4-14 days. They may include: °Fever and chills. °Headaches, body aches, or muscle aches. °Sore throat. °Cough. °Runny or stuffy (congested) nose. °Feeling discomfort in your chest. °Not wanting to eat as much as normal. °Feeling weak or tired. °Feeling dizzy. °Feeling sick to your stomach or throwing up. °How is this treated? °If the flu is found early, you can be treated with antiviral medicine. This can help to reduce how bad the illness is and how long it lasts. This may be given by mouth or through an IV tube. °Taking care of yourself at home can help your  symptoms get better. Your doctor may want you to: °Take over-the-counter medicines. °Drink plenty of fluids. °The flu often goes away on its own. If you have very bad symptoms or other problems, you may be treated in a hospital. °Follow these instructions at home: °  °Activity °Rest as needed. Get plenty of sleep. °Stay home from work or school as told by your doctor. °Do not leave home until you do not have a fever for 24 hours without taking medicine. °Leave home only to go to your doctor. °Eating and drinking °Take an ORS (oral rehydration solution). This is a drink that is sold at pharmacies and stores. °Drink enough fluid to keep your pee pale yellow. °Drink clear fluids in small amounts as you are able. Clear fluids include: °Water. °Ice chips. °Fruit juice mixed with water. °Low-calorie sports drinks. °Eat bland foods that are easy to digest. Eat small amounts as you are able. These foods include: °Bananas. °Applesauce. °Rice. °Lean meats. °Toast. °Crackers. °Do not eat or drink: °Fluids that have a lot of sugar or caffeine. °Alcohol. °Spicy or fatty foods. °General instructions °Take over-the-counter and prescription medicines only as told by your doctor. °Use a cool mist humidifier to add moisture to the air in your home. This can make it easier for you to breathe. °When using a cool mist humidifier, clean it daily. Empty water and replace with clean water. °Cover your mouth and nose when you cough or sneeze. °Wash your hands with soap and water often and for at least 20 seconds. This is also important after   you cough or sneeze. If you cannot use soap and water, use alcohol-based hand sanitizer. °Keep all follow-up visits. °How is this prevented? ° °Get a flu shot every year. You may get the flu shot in late summer, fall, or winter. Ask your doctor when you should get your flu shot. °Avoid contact with people who are sick during fall and winter. This is cold and flu season. °Contact a doctor if: °You get  new symptoms. °You have: °Chest pain. °Watery poop (diarrhea). °A fever. °Your cough gets worse. °You start to have more mucus. °You feel sick to your stomach. °You throw up. °Get help right away if you: °Have shortness of breath. °Have trouble breathing. °Have skin or nails that turn a bluish color. °Have very bad pain or stiffness in your neck. °Get a sudden headache. °Get sudden pain in your face or ear. °Cannot eat or drink without throwing up. °These symptoms may represent a serious problem that is an emergency. Get medical help right away. Call your local emergency services (911 in the [place] °Do not wait to see if the symptoms will go away. °Do not drive yourself to the hospital. °Summary °Influenza is also called "the flu." It is an infection in the lungs, nose, and throat. It spreads easily from person to person. °Take over-the-counter and prescription medicines only as told by your doctor. °Getting a flu shot every year is the best way to not get the flu. °This information is not intended to replace advice given to you by your health care provider. Make sure you discuss any questions you have with your health care provider. °Document Revised: 12/21/2019 Document Reviewed: 12/21/2019 °Elsevier Patient Education © 2022 [employer] Inc. ° °

## 2021-03-17 NOTE — Progress Notes (Signed)
Not fasting

## 2021-03-17 NOTE — Progress Notes (Signed)
[facility]   Subjective:   Zachary Cannon is a 60 y.o. male presents for hospital follow up . Patient does have a primary care prividery. He presented to the emergency room for complaints of abdominal pain--in the periumbilical and lower abdominal area ,non radiating noted started the previous day. When he was riding his bike and gradually worsened, not associated with nausea or vomiting or food intake.  He does report drinks occasionally on weekends in general).  He is having headache, trouble sleeping and was taking over the counter ibuprofen, robitussin-Z and some kind of herbal tea. Admit date to the hospital was 02/05/21, patient was discharged from the hospital on 02/06/21, patient was admitted for: Hypertension, Obesity (BMI 30-39.9), Acute pancreatitis and Abdominal pain. Blood pressure is elevated at this appointment 143/95 currently on metoprolol we will add additional blood pressure medicine and he will follow-up with his PCP. Increase stress mother is 66 was in [place] taking care of her for the last 2 weeks. Mother is positive for COVID last with her 20-28. He denies any s/s and has not been tested. He is vaccinated.  Past Medical History:  Diagnosis Date   HOH (hard of hearing)    uses hearing aids age 89   Hypertension    Obesity (BMI 30-39.9)    Maternal grandfather had a MI  No Known Allergies    Current Outpatient Medications on File Prior to Visit  Medication Sig Dispense Refill   atenolol (TENORMIN) 50 MG tablet Take 1 tablet (50 mg total) by mouth daily. 90 tablet 0   No current facility-administered medications on file prior to visit.   Review of System: Review of Systems  HENT:  Positive for hearing loss.   Psychiatric/Behavioral:  The patient is nervous/anxious.   All other systems reviewed and are negative.  Objective:  BP (!) 138/101 (BP Location: Right Arm, Patient Position: Sitting, Cuff Size: Normal)   Pulse 71   Temp (!) 97.5 F (36.4 C)  (Temporal)   Ht 6' (1.829 m)   Wt 223 lb (101.2 kg)   SpO2 96%   BMI 30.24 kg/m   Filed Weights   03/17/21 0831  Weight: 223 lb (101.2 kg)   Physical Exam: General Appearance: Well nourished, in no apparent distress. Eyes: PERRLA, EOMs, conjunctiva no swelling or erythema Sinuses: No Frontal/maxillary tenderness ENT/Mouth: Ext aud canals clear, TMs without erythema, bulging. Hearing normal. (Bilateral hearing aids)  Neck: Supple, thyroid normal.  Respiratory: Respiratory effort normal, BS equal bilaterally without rales, rhonchi, wheezing or stridor.  Cardio: RRR with no MRGs. Brisk peripheral pulses without edema.  Abdomen: Soft, + BS.  Non tender, no guarding, rebound, hernias, masses. Lymphatics: Non tender without lymphadenopathy.  Musculoskeletal: Full ROM, 5/5 strength, normal gait.  Skin: Warm, dry without rashes, lesions, ecchymosis.  Neuro: Cranial nerves intact. Normal muscle tone, no cerebellar symptoms. Sensation intact.  Psych: Awake and oriented X 3, normal affect, Insight and Judgment appropriate.    Assessment:   Jsaon was seen today for hospitalization follow-up.  Diagnoses and all orders for this visit:  Hospital discharge follow-up Follow-Ups: Follow up with [facility] for hospital f/u . He has a PCP and physical schedule in March.   Primary hypertension Counseled on blood pressure goal of less than 130/80, low-sodium, DASH diet, medication compliance, 150 minutes of moderate intensity exercise per week. Discussed medication compliance, adverse effects. Continue metoprolol 50 mg daily added HCTZ 25 mg.   Obesity (BMI 30-39.9)  Obesity is 30-39 indicating an excess in caloric intake or underlining conditions. This may lead to other co-morbidities. Lifestyle modifications of diet and exercise may reduce obesity.   Plans on increasing physical activity and eats healthy diet.  Elevated LDL cholesterol level Bimetric screening  at work chol-154, HDL 34, TC/HDL ratio 4.5, LDL cholesterol 102, triglycerides 87, fasting blood glucose 122, No medication at this point discussed  Healthy lifestyle diet of fruits vegetables fish nuts whole grains and low saturated fat . Foods high in cholesterol or liver, fatty meats,cheese, butter avocados, nuts and seeds, chocolate and fried foods.    This note has been created with Education officer, environmental. Any transcriptional errors are unintentional.   Grayce Sessions, NP 03/17/2021, 8:40 AM

## 2021-04-01 ENCOUNTER — Encounter (INDEPENDENT_AMBULATORY_CARE_PROVIDER_SITE_OTHER): Payer: Self-pay | Admitting: Primary Care

## 2021-04-01 ENCOUNTER — Ambulatory Visit (INDEPENDENT_AMBULATORY_CARE_PROVIDER_SITE_OTHER): Payer: BC Managed Care – PPO | Admitting: Primary Care

## 2021-04-01 ENCOUNTER — Other Ambulatory Visit: Payer: Self-pay

## 2021-04-01 VITALS — BP 128/87 | HR 67 | Temp 97.5°F | Ht 72.0 in | Wt 219.6 lb

## 2021-04-01 DIAGNOSIS — I10 Essential (primary) hypertension: Secondary | ICD-10-CM

## 2021-04-01 DIAGNOSIS — R7303 Prediabetes: Secondary | ICD-10-CM | POA: Diagnosis not present

## 2021-04-01 DIAGNOSIS — Z131 Encounter for screening for diabetes mellitus: Secondary | ICD-10-CM

## 2021-04-01 LAB — POCT GLYCOSYLATED HEMOGLOBIN (HGB A1C): Hemoglobin A1C: 6.2 % — AB (ref 4.0–5.6)

## 2021-04-01 MED ORDER — HYDROCHLOROTHIAZIDE 25 MG PO TABS: 25.0000 mg | ORAL_TABLET | Freq: Every day | ORAL | 1 refills | Status: AC

## 2021-04-01 MED ORDER — ATENOLOL 50 MG PO TABS: 50.0000 mg | ORAL_TABLET | Freq: Every day | ORAL | 1 refills | Status: AC

## 2021-04-04 NOTE — Progress Notes (Signed)
[facility]   [name] is a 60 y.o. male presents for hypertension evaluation, Denies shortness of breath, headaches, chest pain or lower extremity edema, sudden onset, vision changes, unilateral weakness, dizziness, paresthesias   Patient reports adherence with medications.  Dietary habits include: watching sodium intake Exercise habits include:yes Family / Social history: No   Past Medical History:  Diagnosis Date   HOH (hard of hearing)    uses hearing aids age 41   Hypertension    Obesity (BMI 30-39.9)    Past Surgical History:  Procedure Laterality Date   KNEE ARTHROSCOPY Right 2007   meniscal tear   LASIK  2001   MOLE REMOVAL  1987   benign, foot   NOSE SURGERY  1982   deviated septum   No Known Allergies No current outpatient medications on file prior to visit.   No current facility-administered medications on file prior to visit.   Social History   Socioeconomic History   Marital status: Single    Spouse name: Not on file   Number of children: 0   Years of education: Not on file   Highest education level: Not on file  Occupational History   Occupation: Airline pilot rep    Employer: [employer]  Tobacco Use   Smoking status: Never   Smokeless tobacco: Never  Substance and Sexual Activity   Alcohol use: Yes    Comment: rarely    Drug use: No   Sexual activity: Not on file  Other Topics Concern   Not on file  Social History Narrative   [place]    Social Determinants of Corporate investment banker Strain: Not on [employer] Insecurity: Not on file  Transportation Needs: Not on file  Physical Activity: Not on file  Stress: Not on file  Social Connections: Not on file  Intimate Partner Violence: Not on file   Family History  Problem Relation Age of Onset   CAD Neg Hx    Diabetes Father        late in life   Colon cancer Neg Hx    Prostate cancer Neg Hx      OBJECTIVE:  Vitals:    04/01/21 0829  BP: 128/87  Pulse: 67  Temp: (!) 97.5 F (36.4 C)  TempSrc: Temporal  SpO2: 96%  Weight: 219 lb 9.6 oz (99.6 kg)  Height: 6' (1.829 m)    Physical exam: General: Vital signs reviewed.  Patient is well-developed and well-nourished, overweight male in no acute distress and cooperative with exam. Head: Normocephalic and atraumatic. Eyes: EOMI, conjunctivae normal, no scleral icterus. Neck: Supple, trachea midline, normal ROM, no JVD, masses, thyromegaly, or carotid bruit present. Cardiovascular: RRR, S1 normal, S2 normal, no murmurs, gallops, or rubs. Pulmonary/Chest: Clear to auscultation bilaterally, no wheezes, rales, or rhonchi. Abdominal: Soft, non-tender, non-distended, BS +, no masses, organomegaly, or guarding present. Musculoskeletal: No joint deformities, erythema, or stiffness, ROM full and nontender. Extremities: No lower extremity edema bilaterally,  pulses symmetric and intact bilaterally. No cyanosis or clubbing. Neurological: A&O x3, Strength is normal Skin: Warm, dry and intact. No rashes or erythema. Psychiatric: Normal mood and affect. speech and behavior is normal. Cognition and memory are normal.     ROS Comprehensive ROS noted in HPI positive/negative  Last 3 Office BP readings: BP Readings from Last 3 Encounters:  04/01/21 128/87  03/17/21 (!) 143/95  02/06/21 (!) 143/93    BMET  Component Value Date/Time   NA 138 02/06/2021 0317   K 4.0 02/06/2021 0317   CL 105 02/06/2021 0317   CO2 26 02/06/2021 0317   GLUCOSE 96 02/06/2021 0317   BUN 9 02/06/2021 0317   CREATININE 0.81 02/06/2021 0317   CALCIUM 8.8 (L) 02/06/2021 0317   GFRNONAA >60 02/06/2021 0317    Renal function: CrCl cannot be calculated (Patient's most recent lab result is older than the maximum 21 days allowed.).  Clinical ASCVD: Yes  The 10-year ASCVD risk score (Arnett DK, et al., 2019) is: 10.8%   Values used to calculate the score:     Age: 42 years     Sex:  Male     Is Non-Hispanic African American: No     Diabetic: No     Tobacco smoker: No     Systolic Blood Pressure: 128 mmHg     Is BP treated: Yes     HDL Cholesterol: 28 mg/dL     Total Cholesterol: 163 mg/dL  ASCVD risk factors include- [place]   ASSESSMENT & PLAN:  1. Screening for diabetes mellitus   2. Primary hypertension     Meds ordered this encounter  Medications   atenolol (TENORMIN) 50 MG tablet    Sig: Take 1 tablet (50 mg total) by mouth daily.    Dispense:  90 tablet    Refill:  1    Pt will need to establish with new PCP, will not refill any further.   hydrochlorothiazide (HYDRODIURIL) 25 MG tablet    Sig: Take 1 tablet (25 mg total) by mouth daily.    Dispense:  90 tablet    Refill:  1    Zachary Cannon was seen today for follow-up and medication refill.  Diagnoses and all orders for this visit:  Primary hypertension -Counseled on lifestyle modifications for blood pressure control including reduced dietary sodium, increased exercise, weight reduction and adequate sleep. Also, educated patient about the risk for cardiovascular events, stroke and heart attack. Also counseled patient about the importance of medication adherence. If you participate in smoking, it is important to stop using tobacco as this will increase the risks associated with uncontrolled blood pressure.   -Hypertension longstanding diagnosed currently atenolol 50 MG and  hydrochlorothiazide 25 MG  tablet by mouth daily. And on current medications. Patient is adherent with current medications.   Goal BP:  For patients younger than 60: Goal BP < 130/80. For patients 60 and older: Goal BP < 140/90. For patients with diabetes: Goal BP < 130/80. Your most recent BP: 128/87  Minimize salt intake. Minimize alcohol intake  Screening for diabetes mellitus -     HgB A1c 6.2   Prediabetes Monitor foods that are high in carbohydrates are the following rice, potatoes, breads, sugars, and pastas.  Reduction  in the intake (eating) will assist in lowering your blood sugars.  - BMI less than 24 - Fasting sugar less than 130 or less than 150 if tapering medicines to lose weight  - Systolic BP less than 130  - Diastolic BP less than 80 - Bad LDL Cholesterol less than 70 - Triglycerides less than 150    This note has been created with Education officer, environmental. Any transcriptional errors are unintentional.   Grayce Sessions, NP 04/04/2021, 5:49 PM

## 2021-07-02 ENCOUNTER — Ambulatory Visit (INDEPENDENT_AMBULATORY_CARE_PROVIDER_SITE_OTHER): Payer: BC Managed Care – PPO | Admitting: Primary Care

## 2022-10-09 IMAGING — CT CT ABD-PELV W/ CM
2 of 5 series · 17 of 46 positions shown, 19 images · IV contrast (APPLIED)
Comparison: None.

CLINICAL DATA: Acute nonlocalized abdominal pain.

EXAM:
CT ABDOMEN AND PELVIS WITH CONTRAST
TECHNIQUE: Multidetector CT imaging of the abdomen and pelvis was performed
using the standard protocol following bolus administration of
intravenous contrast.
CONTRAST:  75mL OMNIPAQUE IOHEXOL 350 MG/ML SOLN

[Series 2: abd pel w · axial · 0.92mm/px · z∈[+538,+963]mm · 14 of 97 slices shown, 16 images]
[im 6/97  soft-tissue]
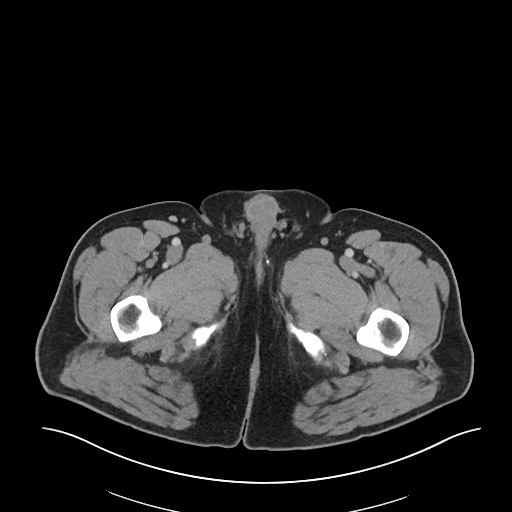
[im 6/97  bone]
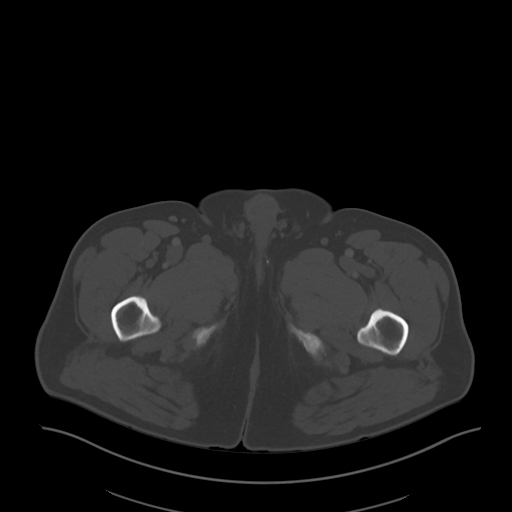
[im 11/97  soft-tissue]
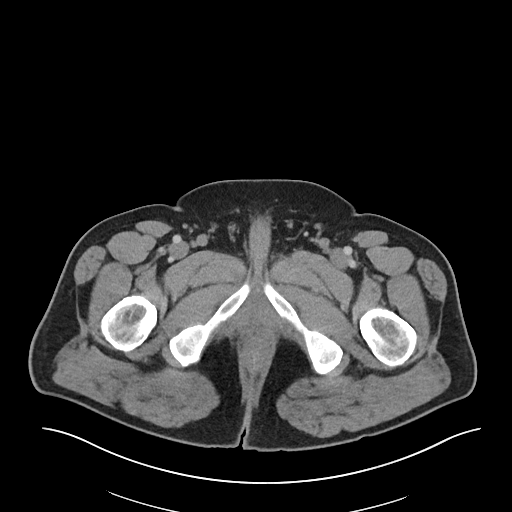
[im 21/97  soft-tissue]
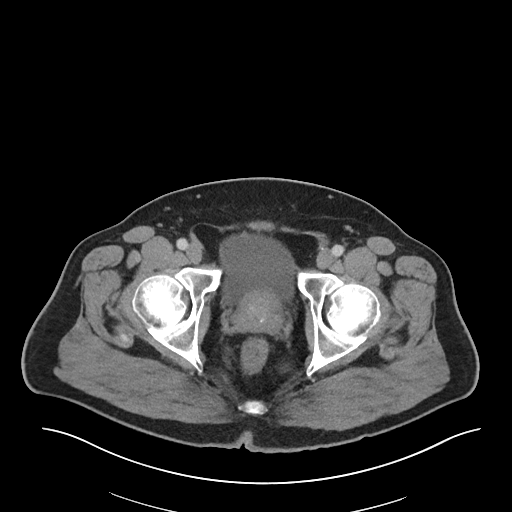
[im 26/97  soft-tissue]
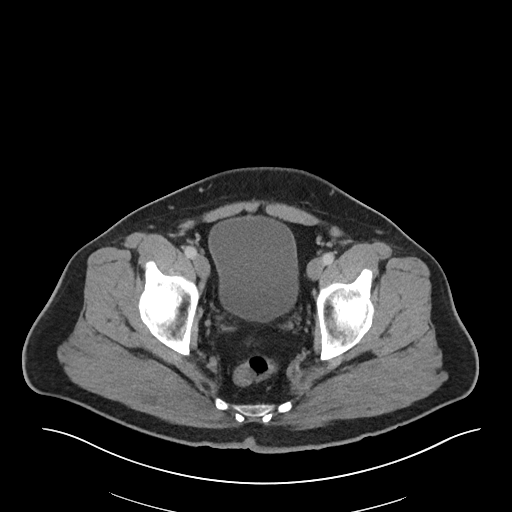
[im 31/97  soft-tissue]
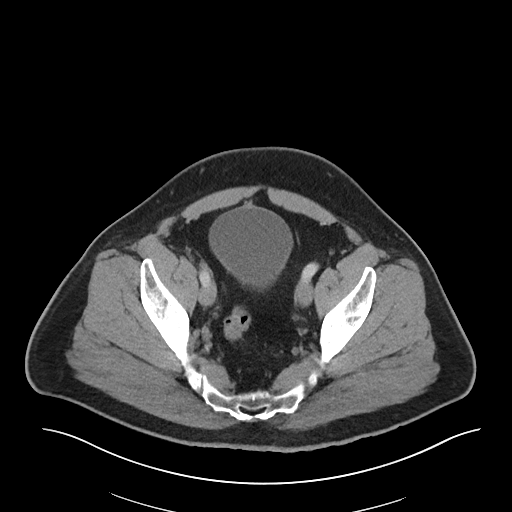
[im 41/97  soft-tissue]
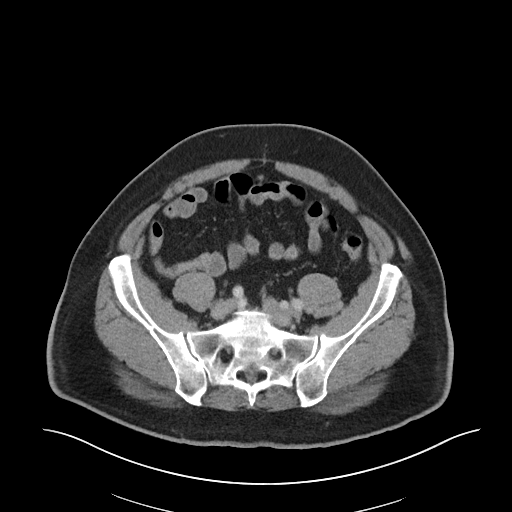
[im 46/97  soft-tissue]
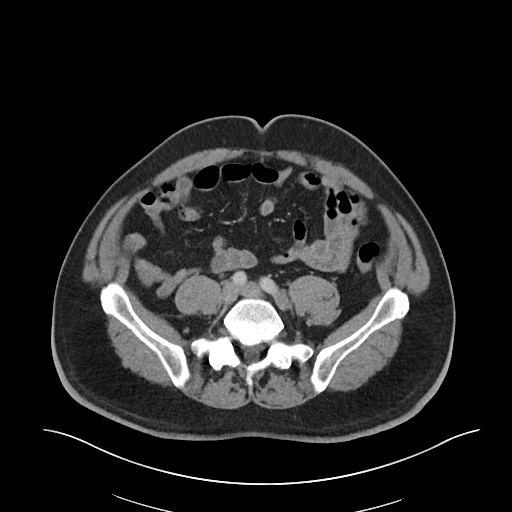
[im 51/97  soft-tissue]
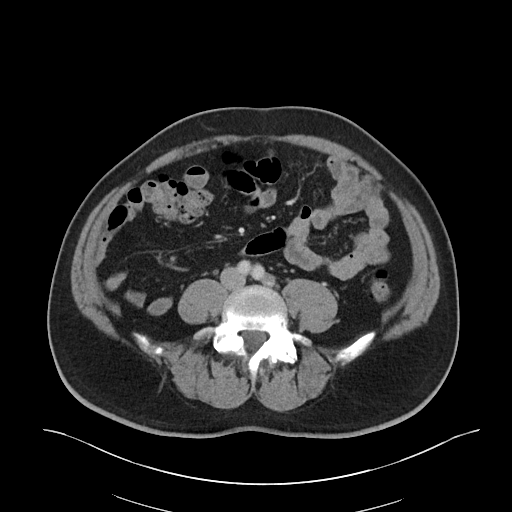
[im 56/97  soft-tissue]
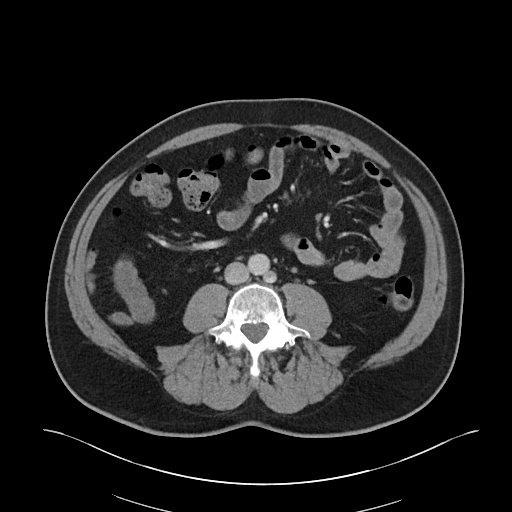
[im 56/97  bone]
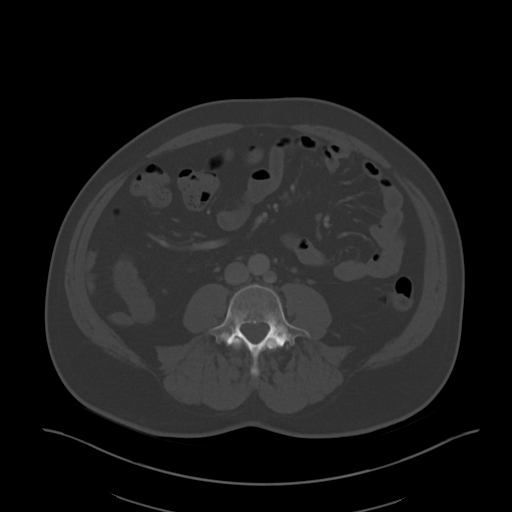
[im 66/97  soft-tissue]
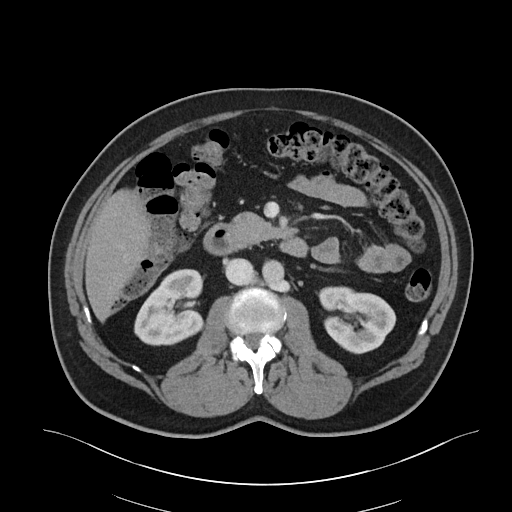
[im 71/97  soft-tissue]
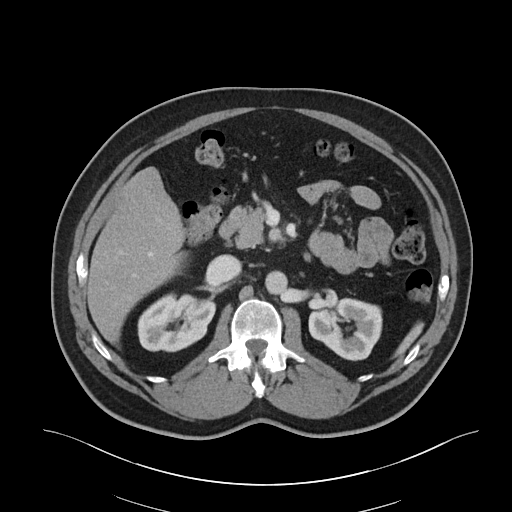
[im 76/97  soft-tissue]
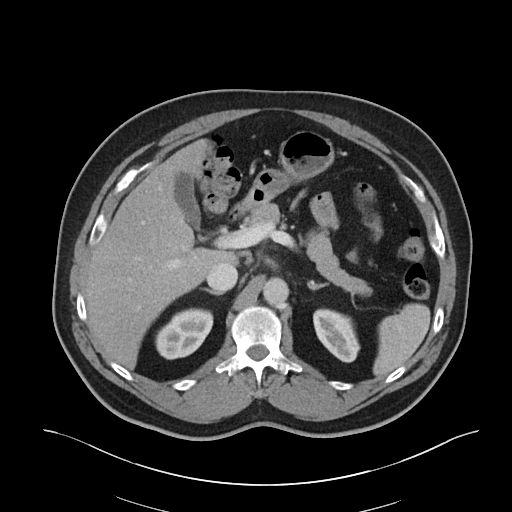
[im 86/97  soft-tissue]
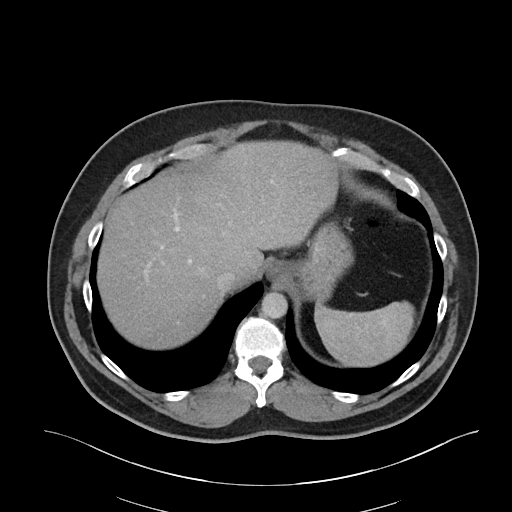
[im 91/97  soft-tissue]
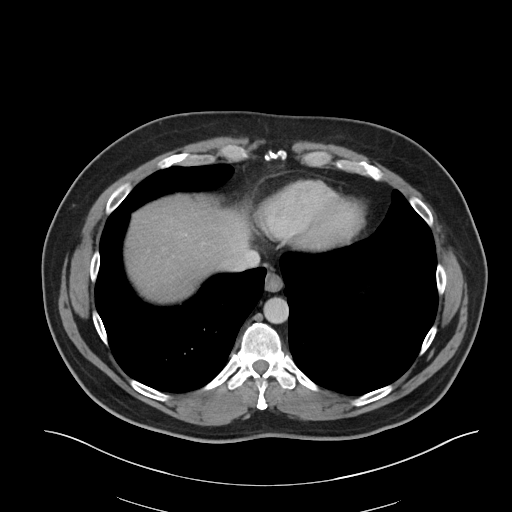

[Series 5: coronal · coronal · 0.89mm/px · 3 of 105 slices shown]
[im 35/105  soft-tissue]
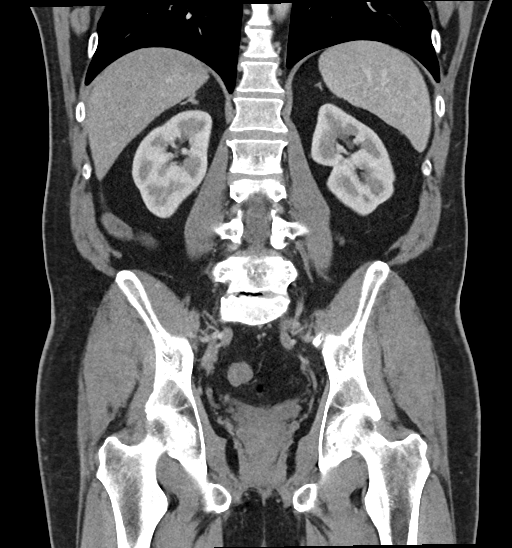
[im 47/105  soft-tissue]
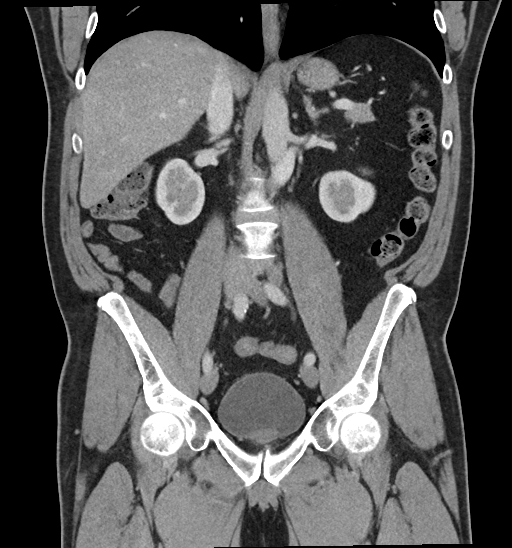
[im 58/105  soft-tissue]
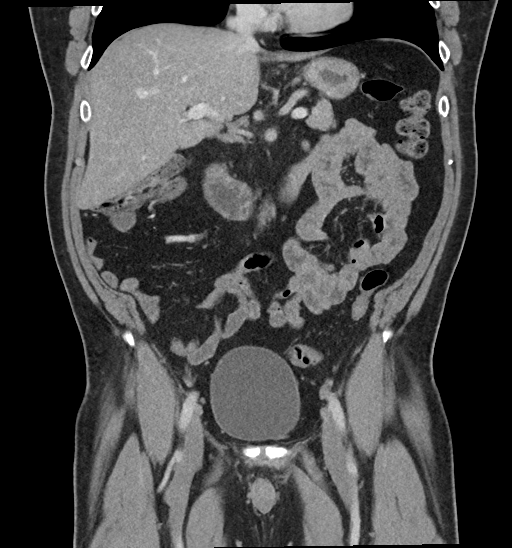

[17 of 46 positions shown; findings below may reference images not displayed]

FINDINGS: Lower chest: Lung bases are clear.

Hepatobiliary: No focal liver abnormality is seen. No gallstones,
gallbladder wall thickening, or biliary dilatation.

Pancreas: Unremarkable. No pancreatic ductal dilatation or
surrounding inflammatory changes.

Spleen: Normal in size without focal abnormality.

Adrenals/Urinary Tract: No adrenal gland nodules. 3 mm stone in the
lower pole right kidney. No hydronephrosis or hydroureter.
Nephrograms are symmetrical. Bladder is normal.

Stomach/Bowel: Stomach, small bowel, and colon are not abnormally
distended. No wall thickening or inflammatory changes are
identified. The appendix is not specifically identified but no
inflammatory changes are seen that would correspond to acute
appendicitis.

Vascular/Lymphatic: Normal caliber abdominal aorta. No aneurysm.
Congenital anomaly with persistent left inferior vena cava.

Reproductive: Prostate is unremarkable.

Other: No free air or free fluid in the abdomen. Minimal
periumbilical hernia containing fat.

Musculoskeletal: No acute or significant osseous findings.
IMPRESSION: No acute process demonstrated in the abdomen or pelvis. No evidence
of bowel obstruction or inflammation. Appendix is not visualized but
no inflammatory changes are seen in the right lower quadrant.
Nonobstructing stone in the right kidney.
